# Patient Record
Sex: Male | Born: 1965 | Hispanic: No | Marital: Single | Smoking: Never smoker
Health system: Southern US, Community
[De-identification: ages and names within clinical notes are randomized; demographics above are authoritative.]

## PROBLEM LIST (undated history)

## (undated) HISTORY — PX: OTHER SURGICAL HISTORY: SHX169

---

## 2000-01-28 ENCOUNTER — Encounter: Payer: Self-pay | Admitting: Emergency Medicine

## 2000-01-28 ENCOUNTER — Emergency Department (HOSPITAL_COMMUNITY): Admission: EM | Admit: 2000-01-28 | Discharge: 2000-01-28 | Payer: Self-pay | Admitting: Emergency Medicine

## 2000-09-03 ENCOUNTER — Emergency Department (HOSPITAL_COMMUNITY): Admission: EM | Admit: 2000-09-03 | Discharge: 2000-09-03 | Payer: Self-pay | Admitting: Emergency Medicine

## 2008-03-09 ENCOUNTER — Emergency Department (HOSPITAL_COMMUNITY): Admission: EM | Admit: 2008-03-09 | Discharge: 2008-03-09 | Payer: Self-pay | Admitting: Emergency Medicine

## 2008-04-22 ENCOUNTER — Ambulatory Visit: Payer: Self-pay | Admitting: Internal Medicine

## 2008-04-22 DIAGNOSIS — R519 Headache, unspecified: Secondary | ICD-10-CM | POA: Insufficient documentation

## 2008-04-22 DIAGNOSIS — D509 Iron deficiency anemia, unspecified: Secondary | ICD-10-CM | POA: Insufficient documentation

## 2008-04-22 DIAGNOSIS — R5383 Other fatigue: Secondary | ICD-10-CM

## 2008-04-22 DIAGNOSIS — R5381 Other malaise: Secondary | ICD-10-CM

## 2008-04-22 DIAGNOSIS — F329 Major depressive disorder, single episode, unspecified: Secondary | ICD-10-CM | POA: Insufficient documentation

## 2008-04-22 DIAGNOSIS — R002 Palpitations: Secondary | ICD-10-CM

## 2008-04-22 DIAGNOSIS — D649 Anemia, unspecified: Secondary | ICD-10-CM

## 2008-04-22 DIAGNOSIS — R51 Headache: Secondary | ICD-10-CM

## 2008-04-22 HISTORY — DX: Palpitations: R00.2

## 2008-04-27 ENCOUNTER — Telehealth: Payer: Self-pay | Admitting: Internal Medicine

## 2008-09-13 ENCOUNTER — Emergency Department (HOSPITAL_COMMUNITY): Admission: EM | Admit: 2008-09-13 | Discharge: 2008-09-13 | Payer: Self-pay | Admitting: Emergency Medicine

## 2009-05-13 ENCOUNTER — Emergency Department (HOSPITAL_COMMUNITY): Admission: EM | Admit: 2009-05-13 | Discharge: 2009-05-13 | Payer: Self-pay | Admitting: Emergency Medicine

## 2010-06-13 LAB — CONVERTED CEMR LAB
Ferritin: 168.3 ng/mL (ref 22.0–322.0)
Folate: 10.8 ng/mL
Free T4: 0.8 ng/dL (ref 0.6–1.6)
Iron: 109 ug/dL (ref 42–165)
Saturation Ratios: 46.7 % (ref 20.0–50.0)
Sed Rate: 4 mm/hr (ref 0–16)
TSH: 0.42 microintl units/mL (ref 0.35–5.50)
Transferrin: 166.6 mg/dL — ABNORMAL LOW (ref >=212.0)
Vitamin B-12: 1011 pg/mL — ABNORMAL HIGH (ref 211–911)

## 2010-08-16 LAB — BASIC METABOLIC PANEL
BUN: 9 mg/dL (ref 6–23)
CO2: 31 mEq/L (ref 19–32)
Calcium: 9.6 mg/dL (ref 8.4–10.5)
Chloride: 101 mEq/L (ref 96–112)
Creatinine, Ser: 1.05 mg/dL (ref 0.4–1.5)
GFR calc Af Amer: >60 mL/min (ref >60)
GFR calc non Af Amer: >60 mL/min (ref >60)
Glucose, Bld: 121 mg/dL — ABNORMAL HIGH (ref 70–99)
Potassium: 3.9 mEq/L (ref 3.5–5.1)
Sodium: 137 mEq/L (ref 135–145)

## 2010-08-16 LAB — GLUCOSE, CAPILLARY: Glucose-Capillary: 157 mg/dL — ABNORMAL HIGH (ref 70–99)

## 2010-08-16 LAB — URINALYSIS, ROUTINE W REFLEX MICROSCOPIC
Bilirubin Urine: NEGATIVE
Glucose, UA: NEGATIVE mg/dL
Hgb urine dipstick: NEGATIVE
Ketones, ur: NEGATIVE mg/dL
Nitrite: NEGATIVE
Protein, ur: NEGATIVE mg/dL
Specific Gravity, Urine: 1.022 (ref 1.005–1.030)
Urobilinogen, UA: 0.2 mg/dL (ref 0.0–1.0)
pH: 7.5 (ref 5.0–8.0)

## 2010-08-16 LAB — DIFFERENTIAL
Basophils Relative: 0 % (ref 0–1)
Eosinophils Absolute: 0.1 10*3/uL (ref 0.0–0.7)
Eosinophils Relative: 1 % (ref 0–5)
Lymphocytes Relative: 16 % (ref 12–46)
Monocytes Relative: 6 % (ref 3–12)
Neutrophils Relative %: 77 % (ref 43–77)

## 2010-08-16 LAB — URIC ACID: Uric Acid, Serum: 4 mg/dL (ref 4.0–7.8)

## 2010-08-16 LAB — OSMOLALITY: Osmolality: 289 mOsm/kg (ref 275–300)

## 2010-08-16 LAB — CBC
Hemoglobin: 13.8 g/dL (ref 13.0–17.0)
RBC: 6.35 MIL/uL — ABNORMAL HIGH (ref 4.22–5.81)
WBC: 6.6 10*3/uL (ref 4.0–10.5)

## 2010-08-24 LAB — DIFFERENTIAL
Basophils Relative: 0 % (ref 0–1)
Eosinophils Relative: 0 % (ref 0–5)
Lymphs Abs: 1.4 10*3/uL (ref 0.7–4.0)
Monocytes Absolute: 0.9 10*3/uL (ref 0.1–1.0)

## 2010-08-24 LAB — COMPREHENSIVE METABOLIC PANEL
ALT: 26 U/L (ref 0–53)
AST: 20 U/L (ref 0–37)
CO2: 29 mEq/L (ref 19–32)
Calcium: 9.6 mg/dL (ref 8.4–10.5)
GFR calc Af Amer: >60 mL/min (ref >60)
GFR calc non Af Amer: >60 mL/min (ref >60)
Sodium: 139 mEq/L (ref 135–145)
Total Protein: 7.4 g/dL (ref 6.0–8.3)

## 2010-08-24 LAB — CBC
MCHC: 32 g/dL (ref 30.0–36.0)
RBC: 6.48 MIL/uL — ABNORMAL HIGH (ref 4.22–5.81)
WBC: 12.4 10*3/uL — ABNORMAL HIGH (ref 4.0–10.5)

## 2011-02-15 LAB — COMPREHENSIVE METABOLIC PANEL
ALT: 26
CO2: 28
Calcium: 9.3
Creatinine, Ser: 1.02
GFR calc non Af Amer: >60
Glucose, Bld: 99

## 2011-02-15 LAB — URINALYSIS, ROUTINE W REFLEX MICROSCOPIC
Bilirubin Urine: NEGATIVE
Glucose, UA: NEGATIVE
Hgb urine dipstick: NEGATIVE
Specific Gravity, Urine: 1.02
pH: 7

## 2011-02-15 LAB — DIFFERENTIAL
Basophils Absolute: 0.1
Eosinophils Absolute: 0.1
Lymphocytes Relative: 16
Monocytes Absolute: 0.4
Neutro Abs: 5.5
Neutrophils Relative %: 75

## 2011-02-15 LAB — CBC
Hemoglobin: 12.7 — ABNORMAL LOW
MCHC: 32.1
MCV: 66.5 — ABNORMAL LOW
RBC: 5.93 — ABNORMAL HIGH

## 2021-09-26 DIAGNOSIS — M272 Inflammatory conditions of jaws: Secondary | ICD-10-CM | POA: Diagnosis present

## 2021-09-26 DIAGNOSIS — F4310 Post-traumatic stress disorder, unspecified: Secondary | ICD-10-CM | POA: Diagnosis present

## 2021-09-26 DIAGNOSIS — D509 Iron deficiency anemia, unspecified: Secondary | ICD-10-CM | POA: Diagnosis present

## 2021-09-26 DIAGNOSIS — A419 Sepsis, unspecified organism: Principal | ICD-10-CM | POA: Diagnosis present

## 2021-09-26 DIAGNOSIS — M8448XA Pathological fracture, other site, initial encounter for fracture: Secondary | ICD-10-CM | POA: Diagnosis present

## 2021-09-26 DIAGNOSIS — K122 Cellulitis and abscess of mouth: Secondary | ICD-10-CM | POA: Diagnosis present

## 2021-09-27 ENCOUNTER — Encounter (HOSPITAL_COMMUNITY)
Admission: EM | Disposition: A | Discharge status: Home Health | Payer: Self-pay | Source: Home / Self Care | Attending: Internal Medicine

## 2021-09-27 ENCOUNTER — Other Ambulatory Visit: Payer: Self-pay

## 2021-09-27 ENCOUNTER — Inpatient Hospital Stay (HOSPITAL_COMMUNITY): Payer: Self-pay | Admitting: Certified Registered Nurse Anesthetist

## 2021-09-27 ENCOUNTER — Emergency Department (HOSPITAL_COMMUNITY): Payer: Self-pay

## 2021-09-27 ENCOUNTER — Inpatient Hospital Stay: Admit: 2021-09-27 | Payer: Self-pay | Admitting: Oral Surgery

## 2021-09-27 ENCOUNTER — Encounter (HOSPITAL_COMMUNITY): Payer: Self-pay | Admitting: Emergency Medicine

## 2021-09-27 ENCOUNTER — Inpatient Hospital Stay (HOSPITAL_COMMUNITY)
Admission: EM | Admit: 2021-09-27 | Discharge: 2021-10-01 | DRG: 854 | Disposition: A | Discharge status: Home Health | Payer: Self-pay | Attending: Internal Medicine | Admitting: Internal Medicine

## 2021-09-27 DIAGNOSIS — D509 Iron deficiency anemia, unspecified: Secondary | ICD-10-CM

## 2021-09-27 DIAGNOSIS — R6510 Systemic inflammatory response syndrome (SIRS) of non-infectious origin without acute organ dysfunction: Secondary | ICD-10-CM

## 2021-09-27 DIAGNOSIS — M272 Inflammatory conditions of jaws: Secondary | ICD-10-CM

## 2021-09-27 DIAGNOSIS — K047 Periapical abscess without sinus: Secondary | ICD-10-CM

## 2021-09-27 DIAGNOSIS — M869 Osteomyelitis, unspecified: Principal | ICD-10-CM

## 2021-09-27 DIAGNOSIS — D649 Anemia, unspecified: Secondary | ICD-10-CM

## 2021-09-27 DIAGNOSIS — M8448XA Pathological fracture, other site, initial encounter for fracture: Secondary | ICD-10-CM | POA: Diagnosis present

## 2021-09-27 DIAGNOSIS — Z720 Tobacco use: Secondary | ICD-10-CM

## 2021-09-27 HISTORY — DX: Systemic inflammatory response syndrome (sirs) of non-infectious origin without acute organ dysfunction: R65.10

## 2021-09-27 HISTORY — PX: TOOTH EXTRACTION: SHX859

## 2021-09-27 LAB — CBC
HCT: 36.1 % — ABNORMAL LOW (ref 39.0–52.0)
Hemoglobin: 11.6 g/dL — ABNORMAL LOW (ref 13.0–17.0)
MCH: 20.8 pg — ABNORMAL LOW (ref 26.0–34.0)
MCHC: 32.1 g/dL (ref 30.0–36.0)
MCV: 64.7 fL — ABNORMAL LOW (ref 80.0–100.0)
Platelets: UNDETERMINED 10*3/uL (ref 150–400)
RBC: 5.58 MIL/uL (ref 4.22–5.81)
RDW: 16.6 % — ABNORMAL HIGH (ref 11.5–15.5)
WBC: 15.3 10*3/uL — ABNORMAL HIGH (ref 4.0–10.5)
nRBC: 0 % (ref 0.0–0.2)

## 2021-09-27 LAB — BASIC METABOLIC PANEL
Anion gap: 6 (ref 5–15)
BUN: 12 mg/dL (ref 6–20)
CO2: 27 mmol/L (ref 22–32)
Calcium: 9 mg/dL (ref 8.9–10.3)
Chloride: 106 mmol/L (ref 98–111)
Creatinine, Ser: 0.79 mg/dL (ref 0.61–1.24)
GFR, Estimated: >60 mL/min (ref >60)
Glucose, Bld: 112 mg/dL — ABNORMAL HIGH (ref 70–99)
Potassium: 3.9 mmol/L (ref 3.5–5.1)
Sodium: 139 mmol/L (ref 135–145)

## 2021-09-27 LAB — IRON AND TIBC
Iron: 18 ug/dL — ABNORMAL LOW (ref 45–182)
Saturation Ratios: 9 % — ABNORMAL LOW (ref 17.9–39.5)
TIBC: 197 ug/dL — ABNORMAL LOW (ref 250–450)
UIBC: 179 ug/dL

## 2021-09-27 LAB — LACTIC ACID, PLASMA
Lactic Acid, Venous: 0.8 mmol/L (ref 0.5–1.9)
Lactic Acid, Venous: 0.7 mmol/L (ref 0.5–1.9)

## 2021-09-27 LAB — SURGICAL PCR SCREEN
MRSA, PCR: NEGATIVE
Staphylococcus aureus: NEGATIVE

## 2021-09-27 LAB — HIV ANTIBODY (ROUTINE TESTING W REFLEX): HIV Screen 4th Generation wRfx: NONREACTIVE

## 2021-09-27 SURGERY — DENTAL RESTORATION/EXTRACTIONS
Anesthesia: General | Laterality: Left

## 2021-09-27 MED ORDER — LIDOCAINE-EPINEPHRINE 1 %-1:100000 IJ SOLN
INTRAMUSCULAR | Status: DC | PRN
  Administered 2021-09-27: 7.5 mL

## 2021-09-27 MED ORDER — ROCURONIUM BROMIDE 10 MG/ML (PF) SYRINGE
PREFILLED_SYRINGE | INTRAVENOUS | Status: DC | PRN
  Administered 2021-09-27: 40 mg via INTRAVENOUS

## 2021-09-27 MED ORDER — LIDOCAINE 2% (20 MG/ML) 5 ML SYRINGE
INTRAMUSCULAR | Status: DC | PRN
  Administered 2021-09-27: 80 mg via INTRAVENOUS
  Administered 2021-09-27: 20 mg via INTRAVENOUS

## 2021-09-27 MED ORDER — FENTANYL CITRATE (PF) 100 MCG/2ML IJ SOLN
INTRAMUSCULAR | Status: AC
  Filled 2021-09-27: qty 2

## 2021-09-27 MED ORDER — PROPOFOL 10 MG/ML IV BOLUS
INTRAVENOUS | Status: AC
  Filled 2021-09-27: qty 20

## 2021-09-27 MED ORDER — MIDAZOLAM HCL 2 MG/2ML IJ SOLN
INTRAMUSCULAR | Status: AC
  Filled 2021-09-27: qty 2

## 2021-09-27 MED ORDER — SODIUM CHLORIDE 0.9 % IV BOLUS
1000.0000 mL | Freq: Once | INTRAVENOUS | Status: AC
  Administered 2021-09-27: 1000 mL via INTRAVENOUS

## 2021-09-27 MED ORDER — ONDANSETRON HCL 4 MG/2ML IJ SOLN
INTRAMUSCULAR | Status: DC | PRN
  Administered 2021-09-27: 4 mg via INTRAVENOUS

## 2021-09-27 MED ORDER — FENTANYL CITRATE (PF) 100 MCG/2ML IJ SOLN
INTRAMUSCULAR | Status: DC | PRN
  Administered 2021-09-27 (×2): 50 ug via INTRAVENOUS

## 2021-09-27 MED ORDER — LIDOCAINE HCL (PF) 2 % IJ SOLN
INTRAMUSCULAR | Status: AC
  Filled 2021-09-27: qty 5

## 2021-09-27 MED ORDER — LACTATED RINGERS IV SOLN: INTRAVENOUS | Status: AC

## 2021-09-27 MED ORDER — OXYMETAZOLINE HCL 0.05 % NA SOLN
NASAL | Status: AC
  Filled 2021-09-27: qty 30

## 2021-09-27 MED ORDER — MIDAZOLAM HCL 5 MG/5ML IJ SOLN
INTRAMUSCULAR | Status: DC | PRN
  Administered 2021-09-27: 2 mg via INTRAVENOUS

## 2021-09-27 MED ORDER — ONDANSETRON HCL 4 MG/2ML IJ SOLN
INTRAMUSCULAR | Status: AC
  Filled 2021-09-27: qty 2

## 2021-09-27 MED ORDER — SUGAMMADEX SODIUM 200 MG/2ML IV SOLN
INTRAVENOUS | Status: DC | PRN
  Administered 2021-09-27: 200 mg via INTRAVENOUS

## 2021-09-27 MED ORDER — CHLORHEXIDINE GLUCONATE CLOTH 2 % EX PADS: 6.0000 | MEDICATED_PAD | Freq: Once | CUTANEOUS | Status: AC

## 2021-09-27 MED ORDER — AMISULPRIDE (ANTIEMETIC) 5 MG/2ML IV SOLN: 10.0000 mg | Freq: Once | INTRAVENOUS | Status: DC | PRN

## 2021-09-27 MED ORDER — LIDOCAINE-EPINEPHRINE 2 %-1:100000 IJ SOLN
INTRAMUSCULAR | Status: AC
  Filled 2021-09-27: qty 1

## 2021-09-27 MED ORDER — PROPOFOL 10 MG/ML IV BOLUS
INTRAVENOUS | Status: DC | PRN
  Administered 2021-09-27: 150 mg via INTRAVENOUS

## 2021-09-27 MED ORDER — SODIUM CHLORIDE 0.9 % IV SOLN
3.0000 g | Freq: Once | INTRAVENOUS | Status: AC
  Administered 2021-09-27: 3 g via INTRAVENOUS
  Filled 2021-09-27: qty 8

## 2021-09-27 MED ORDER — KETOROLAC TROMETHAMINE 30 MG/ML IJ SOLN
30.0000 mg | Freq: Four times a day (QID) | INTRAMUSCULAR | Status: DC | PRN
  Administered 2021-09-27 – 2021-10-01 (×11): 30 mg via INTRAVENOUS
  Filled 2021-09-27 (×11): qty 1

## 2021-09-27 MED ORDER — 0.9 % SODIUM CHLORIDE (POUR BTL) OPTIME
TOPICAL | Status: DC | PRN
  Administered 2021-09-27: 1000 mL

## 2021-09-27 MED ORDER — SUCCINYLCHOLINE CHLORIDE 200 MG/10ML IV SOSY
PREFILLED_SYRINGE | INTRAVENOUS | Status: DC | PRN
  Administered 2021-09-27: 100 mg via INTRAVENOUS

## 2021-09-27 MED ORDER — DEXAMETHASONE SODIUM PHOSPHATE 10 MG/ML IJ SOLN
INTRAMUSCULAR | Status: AC
  Filled 2021-09-27: qty 1

## 2021-09-27 MED ORDER — KETOROLAC TROMETHAMINE 15 MG/ML IJ SOLN
15.0000 mg | Freq: Once | INTRAMUSCULAR | Status: AC
  Administered 2021-09-27: 15 mg via INTRAVENOUS
  Filled 2021-09-27: qty 1

## 2021-09-27 MED ORDER — SODIUM CHLORIDE 0.9 % IV SOLN
3.0000 g | Freq: Four times a day (QID) | INTRAVENOUS | Status: DC
  Administered 2021-09-27 – 2021-09-30 (×13): 3 g via INTRAVENOUS
  Filled 2021-09-27 (×15): qty 8

## 2021-09-27 MED ORDER — ROCURONIUM BROMIDE 10 MG/ML (PF) SYRINGE
PREFILLED_SYRINGE | INTRAVENOUS | Status: AC
Start: 2021-09-27 — End: ?
  Filled 2021-09-27: qty 10

## 2021-09-27 MED ORDER — CHLORHEXIDINE GLUCONATE CLOTH 2 % EX PADS: 6.0000 | MEDICATED_PAD | Freq: Once | CUTANEOUS | Status: DC

## 2021-09-27 MED ORDER — LIDOCAINE-EPINEPHRINE 2 %-1:100000 IJ SOLN
INTRAMUSCULAR | Status: AC
  Filled 2021-09-27: qty 1.7

## 2021-09-27 MED ORDER — DEXAMETHASONE SODIUM PHOSPHATE 10 MG/ML IJ SOLN
INTRAMUSCULAR | Status: DC | PRN
  Administered 2021-09-27: 10 mg via INTRAVENOUS

## 2021-09-27 MED ORDER — SUCCINYLCHOLINE CHLORIDE 200 MG/10ML IV SOSY
PREFILLED_SYRINGE | INTRAVENOUS | Status: AC
Start: 2021-09-27 — End: ?
  Filled 2021-09-27: qty 10

## 2021-09-27 MED ORDER — DEXMEDETOMIDINE (PRECEDEX) IN NS 20 MCG/5ML (4 MCG/ML) IV SYRINGE
PREFILLED_SYRINGE | INTRAVENOUS | Status: DC | PRN
  Administered 2021-09-27 (×2): 12 ug via INTRAVENOUS
  Administered 2021-09-27: 8 ug via INTRAVENOUS

## 2021-09-27 MED ORDER — ONDANSETRON HCL 4 MG/2ML IJ SOLN: 4.0000 mg | Freq: Four times a day (QID) | INTRAMUSCULAR | Status: DC | PRN

## 2021-09-27 MED ORDER — IOHEXOL 300 MG/ML  SOLN
75.0000 mL | Freq: Once | INTRAMUSCULAR | Status: AC | PRN
  Administered 2021-09-27: 75 mL via INTRAVENOUS

## 2021-09-27 MED ORDER — FENTANYL CITRATE PF 50 MCG/ML IJ SOSY: 25.0000 ug | PREFILLED_SYRINGE | INTRAMUSCULAR | Status: DC | PRN

## 2021-09-27 MED ORDER — ONDANSETRON HCL 4 MG PO TABS: 4.0000 mg | ORAL_TABLET | Freq: Four times a day (QID) | ORAL | Status: DC | PRN

## 2021-09-27 SURGICAL SUPPLY — 27 items
BAG COUNTER SPONGE SURGICOUNT (BAG) IMPLANT
BAG SPNG CNTER NS LX DISP (BAG)
BLADE SURG 15 STRL LF DISP TIS (BLADE) ×2 IMPLANT
BLADE SURG 15 STRL SS (BLADE) ×6
BUR CROSS CUT FISSURE 1.6 (BURR) ×1 IMPLANT
BUR EGG DIAMOND 4.0 (BURR) ×1 IMPLANT
GAUZE 4X4 16PLY ~~LOC~~+RFID DBL (SPONGE) ×4 IMPLANT
GAUZE PACKING 2X5 YD STRL (GAUZE/BANDAGES/DRESSINGS) ×2 IMPLANT
GLOVE BIO SURGEON STRL SZ 6.5 (GLOVE) IMPLANT
GLOVE BIO SURGEON STRL SZ7.5 (GLOVE) ×4 IMPLANT
KIT BASIN OR (CUSTOM PROCEDURE TRAY) ×2 IMPLANT
NDL BLUNT 17GA (NEEDLE) ×1 IMPLANT
NEEDLE BLUNT 17GA (NEEDLE) ×2 IMPLANT
NEEDLE HYPO 22GX1.5 SAFETY (NEEDLE) ×2 IMPLANT
NS IRRIG 1000ML POUR BTL (IV SOLUTION) ×2 IMPLANT
PACK EENT SPLIT (PACKS) ×2 IMPLANT
PENCIL SMOKE EVACUATOR (MISCELLANEOUS) IMPLANT
PROTECTOR NERVE ULNAR (MISCELLANEOUS) ×6 IMPLANT
SLEEVE IRRIGATION ELITE 7 (MISCELLANEOUS) ×1 IMPLANT
SUCTION FRAZIER HANDLE 10FR (MISCELLANEOUS) ×2
SUCTION TUBE FRAZIER 10FR DISP (MISCELLANEOUS) IMPLANT
SUT CHROMIC 3 0 PS 2 (SUTURE) ×4 IMPLANT
SWAB COLLECTION DEVICE MRSA (MISCELLANEOUS) ×1 IMPLANT
SWAB CULTURE ESWAB REG 1ML (MISCELLANEOUS) ×1 IMPLANT
SYR 50ML LL SCALE MARK (SYRINGE) ×2 IMPLANT
SYR CONTROL 10ML LL (SYRINGE) ×1 IMPLANT
WATER STERILE IRR 1000ML POUR (IV SOLUTION) ×2 IMPLANT

## 2021-09-27 NOTE — H&P (Signed)
?History and Physical  ? ? ?Patient: Hunter Hoffman [id number] DOB: 1965-08-03 ?DOA: 09/27/2021 ?DOS: the patient was seen and examined on 09/27/2021 ?PCP: Pcp, No  ?Patient coming from: Home ? ?Chief Complaint:  ?Chief Complaint  ?Patient presents with  ? Facial Swelling  ? ?HPI: Hunter Hoffman is a 56 y.o. male with medical history significant of PTSD. Presenting with left facial swelling. Symptoms started about a month ago. He first noticed that his tooth filling came out and there was a crack in his back tooth. He denies any trauma to the area. It wasn't particularly painful at the time. He tried some salt water gargles and rinsing with H2O2, but they didn't seem to help. He was able to largely ignore the area until it became sharply painful over these last two days. He denies any fever, N/V, headache, visual changes, or drainage. ? ?Review of Systems: As mentioned in the history of present illness. All other systems reviewed and are negative. ? ?PMHx ?PTSD ? ?PSHx ?History reviewed. No pertinent surgical history. ? ?Social History:  Occasional tobacco and EtOH usage. Denies illict Rx usage. ? ?No Known Allergies ? ?FamHx ?History reviewed. No pertinent family history. ? ?Prior to Admission medications   ?Medication Sig Start Date End Date Taking? Authorizing Provider  ?acetaminophen (TYLENOL) 500 MG tablet Take 1,000 mg by mouth every 6 (six) hours as needed for moderate pain.   Yes [provider]  ?ibuprofen (ADVIL) 200 MG tablet Take 200 mg by mouth every 6 (six) hours as needed for moderate pain.   Yes [provider]  ? ? ?Physical Exam: ?Vitals:  ? 09/27/21 0028 09/27/21 0400 09/27/21 0552  ?BP: (!) 143/81 123/75   ?Pulse: 79 70   ?Resp: 18 19   ?Temp: (!) 100.5 ?F (38.1 ?C)    ?TempSrc: Oral    ?SpO2: 97% 98%   ?Weight:   82 kg  ?Height:   5\' 8"  (1.727 m)  ? ?General: 56 y.o. male resting in bed in NAD ?Eyes: PERRL, normal sclera ?ENMT: Nares patent w/o discharge, orophaynx clear,  dentition poor, ears w/o discharge/lesions/ulcers, left facial swelling with mild tenderness to manipulation ?Neck: Supple, trachea midline ?Cardiovascular: RRR, +S1, S2, no m/g/r, equal pulses throughout ?Respiratory: CTABL, no w/r/r, normal WOB ?GI: BS+, NDNT, no masses noted, no organomegaly noted ?MSK: No e/c/c ?Neuro: A&O x 3, no focal deficits ?Psyc: Appropriate interaction and affect, calm/cooperative ? ?Data Reviewed: ? ?Na+ 139 ?CO2  27 ?Glucose  112 ?BUN 12 ?Scr  0.79 ?WBC  15.3 ?Hgb  11.6 ?MCV  64.7 ? ?CT Maxillofacial ?Permeative destruction of the left mandible with extensive swelling and ill-defined necrotic or purulent low-density along the outer surface of the mandible in the buccal and masticator spaces. In the setting of fever and leukocytosis, acute osteomyelitis is favored ?over aggressive malignancy. ? ?Assessment and Plan: ?Acute osteomyelitis of the left jaw ?SIRS ?    - admit to inpt, med-surg ?    - continue unasyn ?    - Oral surgery consulted, appreciate assistance, to OR this evening ?    - NPO for now ?    - fluids ?    - check lactic acid and Bld Cx ?    - consulted ID, appreciate assistance ? ?Microcytic anemia ?    - no evidence of bleed ?    - check iron studies ? ?Tobacco abuse ?    - counsel against further use ? ?Advance Care Planning:   Code Status:  FULL ? ?Consults: Oral Surgery ? ?Family Communication: None at bedside ? ?Severity of Illness: ?The appropriate patient status for this patient is INPATIENT. Inpatient status is judged to be reasonable and necessary in order to provide the required intensity of service to ensure the patient's safety. The patient's presenting symptoms, physical exam findings, and initial radiographic and laboratory data in the context of their chronic comorbidities is felt to place them at high risk for further clinical deterioration. Furthermore, it is not anticipated that the patient will be medically stable for discharge from the hospital within 2  midnights of admission.  ? ?* I certify that at the point of admission it is my clinical judgment that the patient will require inpatient hospital care spanning beyond 2 midnights from the point of admission due to high intensity of service, high risk for further deterioration and high frequency of surveillance required.* ? ?Author: ?Jonnie Finner, DO ?09/27/2021 7:10 AM ? ?For on call review [email or web address].  ?

## 2021-09-27 NOTE — Anesthesia Preprocedure Evaluation (Signed)
Anesthesia Evaluation  ?Patient identified by MRN, date of birth, ID band ?Patient awake ? ? ? ?Reviewed: ?Allergy & Precautions, NPO status , Patient's Chart, lab work & pertinent test results ? ?Airway ?Mallampati: Unable to assess ? ? ?Neck ROM: Full ? ?Mouth opening: Limited Mouth Opening ? Dental ? ?(+) Dental Advisory Given ?  ?Pulmonary ?neg pulmonary ROS,  ?  ?breath sounds clear to auscultation ? ? ? ? ? ? Cardiovascular ?negative cardio ROS ? ? ?Rhythm:Regular Rate:Normal ? ? ?  ?Neuro/Psych ?negative neurological ROS ?   ? GI/Hepatic ?negative GI ROS, Neg liver ROS,   ?Endo/Other  ?negative endocrine ROS ? Renal/GU ?negative Renal ROS  ? ?  ?Musculoskeletal ? ? Abdominal ?  ?Peds ? Hematology ? ?(+) Blood dyscrasia, anemia ,   ?Anesthesia Other Findings ? ? Reproductive/Obstetrics ? ?  ? ? ? ? ? ? ? ? ? ? ? ? ? ?  ?  ? ? ? ? ? ? ? ? ?Anesthesia Physical ?Anesthesia Plan ? ?ASA: 3 ? ?Anesthesia Plan: General  ? ?Post-op Pain Management: Ofirmev IV (intra-op)* and Toradol IV (intra-op)*  ? ?Induction: Intravenous ? ?PONV Risk Score and Plan: 2 and Dexamethasone, Ondansetron and Treatment may vary due to age or medical condition ? ?Airway Management Planned: Oral ETT and Video Laryngoscope Planned ? ?Additional Equipment: None ? ?Intra-op Plan:  ? ?Post-operative Plan: Extubation in OR and Possible Post-op intubation/ventilation ? ?Informed Consent: I have reviewed the patients History and Physical, chart, labs and discussed the procedure including the risks, benefits and alternatives for the proposed anesthesia with the patient or authorized representative who has indicated his/her understanding and acceptance.  ? ? ? ?Dental advisory given ? ?Plan Discussed with: CRNA ? ?Anesthesia Plan Comments:   ? ? ? ? ? ? ?Anesthesia Quick Evaluation ? ?

## 2021-09-27 NOTE — TOC Initial Note (Signed)
Transition of Care (TOC) - Initial/Assessment Note  ? ?Patient Details  ?Name: Hunter Hoffman ?MRN: [id number] ?Date of Birth: [birth year] ? ?Transition of Care (TOC) CM/SW Contact:    ?Ewing Schlein, LCSW ?Phone Number: ?09/27/2021, 10:37 AM ? ?Clinical Narrative: Per chart review, patient does not have insurance or a PCP. CSW notified by RN that patient does not want assistance with a PCP referral. ? ?Expected Discharge Plan: Home/Self Care ?Barriers to Discharge: Inadequate or no insurance ? ?Patient Goals and CMS Choice ?Choice offered to / list presented to : NA ? ?Expected Discharge Plan and Services ?Expected Discharge Plan: Home/Self Care ?Post Acute Care Choice: NA ?Living arrangements for the past 2 months: Single Family Home            ?DME Arranged: N/A ?DME Agency: NA ? ?Prior Living Arrangements/Services ?Living arrangements for the past 2 months: Single Family Home ?Patient language and need for interpreter reviewed:: Yes ?Need for Family Participation in Patient Care: No (Comment) ?Care giver support system in place?: Yes (comment) ?Criminal Activity/Legal Involvement Pertinent to Current Situation/Hospitalization: No - Comment as needed ? ?Activities of Daily Living ?Home Assistive Devices/Equipment: None ?ADL Screening (condition at time of admission) ?Patient's cognitive ability adequate to safely complete daily activities?: Yes ?Is the patient deaf or have difficulty hearing?: No ?Does the patient have difficulty seeing, even when wearing glasses/contacts?: No ?Does the patient have difficulty concentrating, remembering, or making decisions?: No ?Patient able to express need for assistance with ADLs?: Yes ?Does the patient have difficulty dressing or bathing?: No ?Independently performs ADLs?: Yes (appropriate for developmental age) ?Does the patient have difficulty walking or climbing stairs?: No ?Weakness of Legs: None ?Weakness of Arms/Hands: None ? 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 Diagnosis Date Noted  ? Acute osteomyelitis of jaw 09/27/2021  ? SIRS (systemic inflammatory response syndrome) (HCC) 09/27/2021  ? Tobacco abuse 09/27/2021  ? Microcytic anemia 04/22/2008  ? DEPRESSION 04/22/2008  ? FATIGUE, CHRONIC 04/22/2008  ? HEADACHE 04/22/2008  ? PALPITATIONS 04/22/2008  ? ?PCP:  Pcp, No ?Pharmacy:   ?[id number] [place], [place] - [address] AT [facility] [address] ?[address] ?[place] [address] ?Phone: [phone] Fax: [phone] ? ?Readmission Risk Interventions ?   ? View : No data to display.  ?  ?  ?  ? ?

## 2021-09-27 NOTE — Anesthesia Procedure Notes (Signed)
Procedure Name: Intubation ?Date/Time: 09/27/2021 4:59 PM ?Performed by: Ezekiel Ina, CRNA ?Pre-anesthesia Checklist: Patient identified, Emergency Drugs available, Suction available and Patient being monitored ?Patient Re-evaluated:Patient Re-evaluated prior to induction ?Oxygen Delivery Method: Circle system utilized ?Preoxygenation: Pre-oxygenation with 100% oxygen ?Induction Type: IV induction ?Ventilation: Mask ventilation without difficulty ?Laryngoscope Size: Glidescope and 4 ?Grade View: Grade II ?Tube type: Reinforced ?Tube size: 7.5 mm ?Number of attempts: 1 ?Airway Equipment and Method: Rigid stylet and Video-laryngoscopy ?Placement Confirmation: ETT inserted through vocal cords under direct vision, positive ETCO2 and breath sounds checked- equal and bilateral ?Secured at: 23 cm ?Tube secured with: Tape ?Dental Injury: Teeth and Oropharynx as per pre-operative assessment  ?Difficulty Due To: Difficulty was anticipated and Difficult Airway- due to limited oral opening ? ? ? ? ?
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
## 2021-09-27 NOTE — Procedures (Incomplete)
?  PRE-OPERATIVE DIAGNOSIS:  Odontogenic infection ?POST-OPERATIVE DIAGNOSIS:  Same  ?  ?SURGEON:  Surgeon(s) and Role: ?   Ross Marcus, Lavell Anchors, DMD - Primary ?  ?Procedure: ?Surgical removal #1-3, 14, 16, 17-21, 31, 32 ([id number] x 12) ?I&D of masticator space  ?I&D of buccal space ?Debridement of the mandible ?  ?Description of Operation/Procedure: ?  ?The patient was encountered in [facility] OR Room ***.  General anesthesia was induced and an oral ETT was secured in the standard fashion.  The table was moved slightly away from anesthesia and the patient was properly padded, relieving all pressure points.  A formal time-out was executed.  2% lidocaine with 1:100,000 epinephrine was infiltrated into the proposed surgical sites.  The patient was prepped and draped in the standard sterile fashion and a throat pack was placed.              ?  ? ?Attention was directed to #1.  A FTMPF was reflected as necessary, bone was removed around the tooth which was removed surgically without complication.  Bone was smoothed as necessary, and the site was curetted and irrigated.  The site was closed. An identical procedure was completed for #2-3, 14, 16, 31 and 32. ?  ?Attention was then directed intraorally to the lower left.  A full thickness mucoperiosteal flap was elevated on both the buccal and lingual.  Blunt dissection was carried to the buccal and masticator spaces yielding purulence.. This was cultured sent for aerobic and anaerobic cultures and a stat Gram stain.  Teeth #17-21 were removed with standard elevator and forceps technique without complication. The extraction sites were thoroughly curetted, bone was smoothed, and the sites thoroughly irrigated. The mandible was found to have extensive amount of loose bony sequestra and granulation tissue. Samples of this hard/soft tissue was submitted for both micro and pathological review. *** ? ?Areas were thoroughly irrigated.  The full thickness mucoperiosteal flap was  reapproximated with 3-0 chromic gut suture.  The oral cavity was suctioned free of all debris and secretions.  The throat pack was removed and an orogastric tube was passed to evacuate the stomach contents.  Sponge and needle counts were correct x 2.  Care of the patient was turned over to the Anesthesia team for uneventful extubation and delivery of the patient to the PACU in stable condition. ?  ?ANESTHESIA:   general ?EBL:  50 mL  ?DRAINS: None ?LOCAL MEDICATIONS USED:  LIDOCAINE 2% w/ 1:100000 epi ?SPECIMEN:  Swab, bone/soft tissue - cultures for anaerobes, aerobes, and fungal as well as final path ?PLAN OF CARE: Return to floor ?PATIENT DISPOSITION:  PACU - hemodynamically stable. ?Delay start of Pharmacological VTE agent (>24hrs) due to surgical blood loss or risk of bleeding: no ?  ?OMFS Recommendations ?-Liquid diet today, advance to soft mechanical diet tomorrow ?-Peridex (chlorhexidine) mouthrnise QID ?-Continue Unasyn q6hr ?-Recommend Infectious disease consult for inpatient and outpatient antibiotic regimen recommendations ?-Discussed with patient we will address his acute exacerbation during this hospital admission however due to the severity of his bony destruction of CT there is a good change he will require additional surgery and possibly may need mandibular resection/reconstruction; this will require a higher level of care and necessity will be determined with follow-up ?

## 2021-09-27 NOTE — H&P (Signed)
H&P Infection ? ?Exam Date: 09/27/21 ? ?ID: The patient is a 16 yoM who presented with pain and swelling of the left face. ? ?History of Present Illness:  The patient reports having pain and swelling of the left jaw that began over a month ago but got acutely worse over the weekend.  The patient was referred by ED for evaluation; CT maxface concerning for osteomyelitis of the left mandible. Denies history of cancer, H&N radiation or bisphosphonate treatment.  The patient reports trismus and pain.  Denies dyspnea or dysphagia.  Patient is NPO and on Unasyn. ?  ?Clinical Exam: ?Extraoral Exam:  ?            Patient is alert, orientated and in mild distress ?            CN II-XII grossly intact with left V3 parethesia ?            There is appreciable facial swelling on the left side of the face.  ?            The swelling does not extend into the neck/periorbital region  ?  ?Intraoral Exam:  ?            The patient does have trismus with maximum incisal opening of 20 mm. ?            The buccal vestibule is raised. ?            The floor of mouth is soft and non-elevated ?            The tongue is not elevated. ?            There is not lateral pharyngeal swelling or palatal draping ?            Oral airway is patent  ?            Dental evaluation is limited due to trismus ?  ?            Cardiovascular:  Regular rate and rhythm without any appreciable murmurs, gallops or rubs.   ?            Respiratory: Lungs were clear to auscultation bilaterally without any wheezing or rhonchi. ?            Abdomen: Non-distended  ?  ?Radiographic Exam:   ?            Panorex imaging not available. ?             ?            A CT of the larynx with contrast was obtained showing fluid collections/cellulitis associated with the left buccal/masticator spaces. There is no airway deviation. There is a moth-eaten, radiolucencies of the left mandible with cortical disruption extending from tooth #21 posteriorly to the sigmoid notch. The  osteolytic changes extend to the inferior border of the mandible as well as the posterior border of the ramus; no clear evidence of pathologic fracture at this time however cannot rule that out. ? ?CT shows generalized poor dentition/caries with periapical radiolucencies associated with teeth #1-3, 14, 16, 31, and 32 as well as above-mentioned osteomyelitis involving teeth #17-21. ?  ?Assessment: 62 yoM patient with presumed osteomyelitis of the left mandible and acute exacerbation resulting in left buccal/masticator space infection.  ?  ?Plan:  The patient will require incision and drainage of the buccal and masticator space infection with extraction of teeth as  necessary, including #1-3, 14, 16, 17-21, 31, 32 as well as exploration/conservative debridement of the left mandible in the OR.  Consent will be obtained and will be scanned into EPIC. ? ?-Recommend Infectious disease consult for outpatient antibiotic regimen; will obtain path specimen and cultures intra-op ?-Discussed with patient we will address his acute exacerbation during this hospital admission however due to the severity of his bony destruction of CT there is a good change he will require additional surgery and possibly may need mandibular resection/reconstruction; this will require a higher level of care and necessity will be determined with follow-up ?  ?Risks, complications and alternatives of tooth extraction and incision and drainage procedures were discussed and questions were answered.  Among all potential risks and complications, I emphasized the potential for pain, bleeding, swelling, infection, localized alveolar osteitis (dry socket), temporary and permanent lingual and inferior alveolar nerve injury, oroantral (sinus) communication, oronasal communication, jaw fracture, damage to adjacent teeth and tissue, joint discomfort, bone/tooth fragments, recurrence of infection, need for additional procedures, facial nerve injury, scarring,  limited mouth opening, drain placement, aspiration and anesthetic mishap. ?  ?Terese Door, DDS ?Oral and Maxillofacial Surgeon ?[facility] ([place] ?Office # [phone] ?Cell # [phone] ? ?

## 2021-09-27 NOTE — ED Provider Notes (Signed)
?[facility]-EMERGENCY DEPT ?[facility] Emergency Department ?Provider Note ?MRN:  [id number]  ?Arrival date & time: 09/27/21    ? ?Chief Complaint   ?Facial Swelling ?  ?History of Present Illness   ?Hunter Hoffman is a 56 y.o. year-old male with no pertinent past medical history presenting to the ED with chief complaint of facial swelling. ? ?Some dental pain and left-sided facial swelling for the past week or so.  Thought it would go away but it has not.  Swelling has worsened, trouble opening the mouth. ? ?Review of Systems  ?A thorough review of systems was obtained and all systems are negative except as noted in the HPI and PMH.  ? ?Patient's Health History   ?History reviewed. No pertinent past medical history.  ?History reviewed. No pertinent surgical history.  ?History reviewed. No pertinent family history.  ?Social History  ? ?Socioeconomic History  ? Marital status: Single  ?  Spouse name: Not on file  ? Number of children: Not on file  ? Years of education: Not on file  ? Highest education level: Not on file  ?Occupational History  ? Not on file  ?Tobacco Use  ? Smoking status: Not on file  ? Smokeless tobacco: Not on file  ?Substance and Sexual Activity  ? Alcohol use: Not on file  ? Drug use: Not on file  ? Sexual activity: Not on file  ?Other Topics Concern  ? Not on file  ?Social History Narrative  ? Not on file  ? ?Social Determinants of Health  ? ?Financial Resource Strain: Not on file  ?Food Insecurity: Not on file  ?Transportation Needs: Not on file  ?Physical Activity: Not on file  ?Stress: Not on file  ?Social Connections: Not on file  ?Intimate Partner Violence: Not on file  ?  ? ?Physical Exam  ? ?Vitals:  ? 09/27/21 0028 09/27/21 0400  ?BP: (!) 143/81 123/75  ?Pulse: 79 70  ?Resp: 18 19  ?Temp: (!) 100.5 ?F (38.1 ?C)   ?SpO2: 97% 98%  ?  ?CONSTITUTIONAL: Well-appearing, NAD ?NEURO/PSYCH:  Alert and oriented x 3, no focal deficits ?EYES:  eyes equal and reactive ?ENT/NECK:  no LAD, no  JVD ?CARDIO: Regular rate, well-perfused, normal S1 and S2 ?PULM:  CTAB no wheezing or rhonchi ?GI/GU:  non-distended, non-tender ?MSK/SPINE:  No gross deformities, no edema ?SKIN: Prominent swelling to the left side of the face largely involving the buccal mucosa ? ? ?*Additional and/or pertinent findings included in MDM below ? ?Diagnostic and Interventional Summary  ? ? EKG Interpretation ? ?Date/Time:    ?Ventricular Rate:    ?PR Interval:    ?QRS Duration:   ?QT Interval:    ?QTC Calculation:   ?R Axis:     ?Text Interpretation:   ?  ? ?  ? ?Labs Reviewed  ?CBC - Abnormal; Notable for the following components:  ?    Result Value  ? WBC 15.3 (*)   ? Hemoglobin 11.6 (*)   ? HCT 36.1 (*)   ? MCV 64.7 (*)   ? MCH 20.8 (*)   ? RDW 16.6 (*)   ? All other components within normal limits  ?BASIC METABOLIC PANEL - Abnormal; Notable for the following components:  ? Glucose, Bld 112 (*)   ? All other components within normal limits  ?  ?CT Maxillofacial W Contrast  ?Final Result  ?  ?  ?Medications  ?lactated ringers infusion ( Intravenous New Bag/Given 09/27/21 0631)  ?Ampicillin-Sulbactam (UNASYN) 3 g  in sodium chloride 0.9 % 100 mL IVPB (has no administration in time range)  ?ketorolac (TORADOL) 15 MG/ML injection 15 mg (15 mg Intravenous Given 09/27/21 0323)  ?Ampicillin-Sulbactam (UNASYN) 3 g in sodium chloride 0.9 % 100 mL IVPB (0 g Intravenous Stopped 09/27/21 0631)  ?sodium chloride 0.9 % bolus 1,000 mL (0 mLs Intravenous Stopped 09/27/21 0631)  ?iohexol (OMNIPAQUE) 300 MG/ML solution 75 mL (75 mLs Intravenous Contrast Given 09/27/21 0411)  ?  ? ?Procedures  /  Critical Care ?.Critical Care ?Performed by: Sabas Sous, MD ?Authorized by: Sabas Sous, MD  ? ?Critical care provider statement:  ?  Critical care time (minutes):  35 ?  Critical care was necessary to treat or prevent imminent or life-threatening deterioration of the following conditions: Osteomyelitis. ?  Critical care was time spent personally by me  on the following activities:  Development of treatment plan with patient or surrogate, discussions with consultants, evaluation of patient's response to treatment, examination of patient, ordering and review of laboratory studies, ordering and review of radiographic studies, ordering and performing treatments and interventions, pulse oximetry, re-evaluation of patient's condition and review of old charts ? ?ED Course and Medical Decision Making  ?Initial Impression and Ddx ?Concern for facial cellulitis versus odontogenic abscess.  Febrile on arrival but well-appearing, otherwise normal vital signs.  Awaiting CT imaging. ? ?Past medical/surgical history that increases complexity of ED encounter: None ? ?Interpretation of Diagnostics ?I personally reviewed the laboratory assess and my interpretation is as follows: Leukocytosis, no significant electrolyte disturbance ?   ?CT imaging revealing destructive osteomyelitis of the mandible ? ?Patient Reassessment and Ultimate Disposition/Management ?Case discussed with oral surgery who will follow along, admitted to hospital service for further care. ? ?Patient management required discussion with the following services or consulting groups:  Hospitalist Service and ENT/Plastic Surgery ? ?Complexity of Problems Addressed ?Acute illness or injury that poses threat of life of bodily function ? ?Additional Data Reviewed and Analyzed ?Further history obtained from: ?None ? ?Additional Factors Impacting ED Encounter Risk ?Consideration of hospitalization ? ?Elmer Sow. Pilar Plate, MD ?[facility] ?[facility] [email or web address] ? ?Final Clinical Impressions(s) / ED Diagnoses  ? ?  ICD-10-CM   ?1. Osteomyelitis of other site, unspecified type (HCC)  M86.9   ?  ?  ?ED Discharge Orders   ? ? None  ? ?  ?  ? ?Discharge Instructions Discussed with and Provided to Patient:  ? ?Discharge Instructions   ?None ?  ? ?  ?Sabas Sous, MD ?09/27/21 [phone] ? ?

## 2021-09-27 NOTE — Consult Note (Signed)
?  [facility]  ? ? ?Date of Admission:  09/27/2021    ? ?Reason for Consult: Osteomyelitis ?    ?Referring Physician: Dr. Marylyn Ishihara ? ?Current antibiotics: ?Unasyn 5/15--present ? ? ? ?ASSESSMENT:   ? ?56 y.o. male admitted with: ? ?Osteomyelitis of the left jaw: Presenting with approximately 1 month of symptoms with acute worsening over the last 2 to 3 days prior to admission where he was noted to be febrile with leukocytosis.  CT shows permeative destruction of the left mandible with extensive swelling and ill-defined necrotic or purulent low density along the outer surface of the mandible in the buccal and masticator spaces concerning for acute osteomyelitis. ?Sepsis: Secondary to #1. ? ?RECOMMENDATIONS:   ? ?Continue Unasyn ?OR today with oral surgery where cultures and path will be obtained ?Follow blood cultures ?Lab monitoring ?Will follow ? ? ?Principal Problem: ?  Acute osteomyelitis of jaw ?Active Problems: ?  Microcytic anemia ?  SIRS (systemic inflammatory response syndrome) (HCC) ?  Tobacco abuse ? ? ?MEDICATIONS:   ? ?Scheduled Meds: ?Continuous Infusions: ? ampicillin-sulbactam (UNASYN) IV 3 g (09/27/21 1055)  ? lactated ringers 125 mL/hr at 09/27/21 0631  ? ?PRN Meds:.ketorolac, ondansetron **OR** ondansetron (ZOFRAN) IV ? ?HPI:   ? ?Hunter Hoffman is a 56 y.o. male with no significant past medical history presented to the emergency department with a chief complaint of left-sided facial swelling and dental pain.  He reports that his symptoms a started approximately 1 month ago.  He noticed that he had a prior feeling that had fallen out as well as a crack in his back tooth.  Denied any particular trauma to the area.  This area was not particularly painful and he tried to manage with salt water gargles.  However this area became acutely swollen and painful over the last 2 days prompting him to come to the ER.  Upon presentation, he was noted to be febrile at 100.5.  Imaging was  obtained which showed permeative destruction of the left mandible with extensive swelling and ill-defined necrotic or purulent low-density along the outer surface of the mandible in the buccal and masticator spaces.  This was felt to be suggestive of acute osteomyelitis given his fevers and leukocytosis.  He was started on Unasyn.  He will be going to the OR later this afternoon with oral surgery. ? ? ?History reviewed. No pertinent past medical history. ? ?Social History  ? ?Tobacco Use  ? Smoking status: Never  ? Smokeless tobacco: Never  ? ? ?History reviewed. No pertinent family history. ? ?No Known Allergies ? ?Review of Systems  ?All other systems reviewed and are negative.  Except as noted above in the HPI. ? ?OBJECTIVE:  ? ?Blood pressure 131/80, pulse 68, temperature 99.1 ?F (37.3 ?C), temperature source Oral, resp. rate 15, height 5' 8"  (1.727 m), weight 82 kg, SpO2 100 %. ?Body mass index is 27.49 kg/m?. ? ?Physical Exam ?Constitutional:   ?   General: He is not in acute distress. ?   Appearance: Normal appearance.  ?HENT:  ?   Head:  ?   Comments: Left-sided facial swelling ?Eyes:  ?   Extraocular Movements: Extraocular movements intact.  ?   Conjunctiva/sclera: Conjunctivae normal.  ?Cardiovascular:  ?   Rate and Rhythm: Normal rate and regular rhythm.  ?Pulmonary:  ?   Effort: Pulmonary effort is normal. No respiratory distress.  ?   Breath sounds: Normal breath sounds.  ?Abdominal:  ?  General: There is no distension.  ?   Palpations: Abdomen is soft.  ?Musculoskeletal:     ?   General: Normal range of motion.  ?   Cervical back: Normal range of motion and neck supple.  ?Skin: ?   General: Skin is warm and dry.  ?Neurological:  ?   General: No focal deficit present.  ?   Mental Status: He is alert and oriented to person, place, and time.  ?Psychiatric:     ?   Mood and Affect: Mood normal.     ?   Behavior: Behavior normal.  ? ? ? ?Lab Results: ?Lab Results  ?Component Value Date  ? WBC 15.3 (H)  09/27/2021  ? HGB 11.6 (L) 09/27/2021  ? HCT 36.1 (L) 09/27/2021  ? MCV 64.7 (L) 09/27/2021  ? PLT PLATELET CLUMPS NOTED ON SMEAR, UNABLE TO ESTIMATE 09/27/2021  ?  ?Lab Results  ?Component Value Date  ? NA 139 09/27/2021  ? K 3.9 09/27/2021  ? CO2 27 09/27/2021  ? GLUCOSE 112 (H) 09/27/2021  ? BUN 12 09/27/2021  ? CREATININE 0.79 09/27/2021  ? CALCIUM 9.0 09/27/2021  ? GFRNONAA >60 09/27/2021  ? GFRAA  05/13/2009  ?  >60        ?The eGFR has been calculated ?using the MDRD equation. ?This calculation has not been ?validated in all clinical ?situations. ?eGFR's persistently ?<60 mL/min signify ?possible Chronic Kidney Disease.  ?  ?Lab Results  ?Component Value Date  ? ALT 26 09/13/2008  ? AST 20 09/13/2008  ? ALKPHOS 61 09/13/2008  ? BILITOT 1.0 09/13/2008  ? ? ?No results found for: CRP ? ?   ?Component Value Date/Time  ? ESRSEDRATE 4 04/22/2008 0000  ? ? ?I have reviewed the micro and lab results in Epic. ? ?Imaging: ?CT Maxillofacial W Contrast ? ?Result Date: 09/27/2021 ?CLINICAL DATA:  Maxillary/facial abscess EXAM: CT MAXILLOFACIAL WITH CONTRAST TECHNIQUE: Multidetector CT imaging of the maxillofacial structures was performed with intravenous contrast. Multiplanar CT image reconstructions were also generated. RADIATION DOSE REDUCTION: This exam was performed according to the departmental dose-optimization program which includes automated exposure control, adjustment of the mA and/or kV according to patient size and/or use of iterative reconstruction technique. CONTRAST:  59m OMNIPAQUE IOHEXOL 300 MG/ML  SOLN COMPARISON:  None Available. FINDINGS: Osseous: Permeative destruction in the left mandible extending from the level of the canine to the coronoid process and mandibular neck. The regional soft tissues are swollen at the level of the masticator space and cheek with ill-defined low-density and peripheral enhancement along the superficial aspect of the diseased bone. Diffuse dental caries with periapical  erosion without single offending tooth seen in the left mandible. Acute osteomyelitis or aggressive malignancy are both considered with this radiographic appearance. Case discussed with Dr. BSedonia Small somewhat protracted symptoms but there is also fever and white count. Inflammatory markers, blood cultures, or biopsy may clarify. 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 There is C4-5 non segmentation with subjacent predominant disc collapse, spurring, and endplate sclerosis. Asymmetric upper right facet spurring encroaching on the foramina. Orbits: No evidence of inflammation or mass. Sinuses: Negative Soft tissues: Negative Limited intracranial: Negative IMPRESSION: Permeative destruction of the left mandible with extensive swelling and ill-defined necrotic or purulent low-density along the outer surface of the mandible in the buccal and masticator spaces. In the setting of fever and leukocytosis, acute osteomyelitis is favored over aggressive malignancy. Electronically Signed   By: JJorje GuildM.D.   On: 09/27/2021 04:36    ? ?  Imaging independently reviewed in Epic. ? ?Mignon Pine ?[facility] ?[place] ?[phone] pager ?09/27/2021, 11:55 AM ? ? ?

## 2021-09-27 NOTE — Progress Notes (Signed)
Pharmacy Antibiotic Note ? ?Hunter Hoffman is a 56 y.o. male admitted on 09/27/2021 with mandible abscess.  Pharmacy has been consulted for Unasyn dosing. ? ?Plan: ?Unasyn 3gm IV q6h ?Need for further dosage adjustment appears unlikely at present.   ? ?Will sign off at this time.  Please reconsult if a change in clinical status warrants re-evaluation of dosage. ? ? ? ?Height: 5\' 8"  (172.7 cm) ?Weight: 82 kg (180 lb 12.4 oz) ?IBW/kg (Calculated) : 68.4 ? ?Temp (24hrs), Avg:100.5 ?F (38.1 ?C), Min:100.5 ?F (38.1 ?C), Max:100.5 ?F (38.1 ?C) ? ?Recent Labs  ?Lab 09/27/21 ?0324  ?WBC 15.3*  ?CREATININE 0.79  ?  ?Estimated Creatinine Clearance: 100.9 mL/min (by C-G formula based on SCr of 0.79 mg/dL).   ? ?No Known Allergies ?  ? ?Thank you for allowing pharmacy to be a part of this patient?s care. ? ?Maddix Kliewer, 09/29/21, PharmD ?09/27/2021 6:04 AM ? ?

## 2021-09-27 NOTE — Transfer of Care (Signed)
Immediate Anesthesia Transfer of Care Note ? ?Patient: Hunter Hoffman ? ?Procedure(s) Performed: Incision and drainage of facial swelling, debridement of mandible and extraction of teeth #1, 3, 31, 32, 16, 17, 18, and 19 (Left) ? ?Patient Location: PACU ? ?Anesthesia Type:General ? ?Level of Consciousness: sedated, patient cooperative and responds to stimulation ? ?Airway & Oxygen Therapy: Patient Spontanous Breathing and Patient connected to face mask oxygen ? ?Post-op Assessment: Report given to RN and Post -op Vital signs reviewed and stable ? ?Post vital signs: Reviewed and stable ? ?Last Vitals:  ?Vitals Value Taken Time  ?BP 135/81 09/27/21 1818  ?Temp    ?Pulse 72 09/27/21 1822  ?Resp 13 09/27/21 1822  ?SpO2 100 % 09/27/21 1822  ?Vitals shown include unvalidated device data. ? ?Last Pain:  ?Vitals:  ? 09/27/21 1532  ?TempSrc:   ?PainSc: 0-No pain  ?   ? ?Patients Stated Pain Goal: 3 (09/27/21 0845) ? ?Complications: No notable events documented. ?

## 2021-09-28 ENCOUNTER — Encounter (HOSPITAL_COMMUNITY): Payer: Self-pay | Admitting: Oral Surgery

## 2021-09-28 ENCOUNTER — Inpatient Hospital Stay: Payer: Self-pay

## 2021-09-28 ENCOUNTER — Inpatient Hospital Stay (HOSPITAL_COMMUNITY): Payer: Self-pay

## 2021-09-28 DIAGNOSIS — D509 Iron deficiency anemia, unspecified: Secondary | ICD-10-CM

## 2021-09-28 DIAGNOSIS — M869 Osteomyelitis, unspecified: Secondary | ICD-10-CM

## 2021-09-28 DIAGNOSIS — Z720 Tobacco use: Secondary | ICD-10-CM

## 2021-09-28 DIAGNOSIS — M8448XA Pathological fracture, other site, initial encounter for fracture: Secondary | ICD-10-CM | POA: Diagnosis present

## 2021-09-28 LAB — CBC
HCT: 34.4 % — ABNORMAL LOW (ref 39.0–52.0)
Hemoglobin: 10.4 g/dL — ABNORMAL LOW (ref 13.0–17.0)
MCH: 19.9 pg — ABNORMAL LOW (ref 26.0–34.0)
MCHC: 30.2 g/dL (ref 30.0–36.0)
MCV: 65.8 fL — ABNORMAL LOW (ref 80.0–100.0)
Platelets: UNDETERMINED 10*3/uL (ref 150–400)
RBC: 5.23 MIL/uL (ref 4.22–5.81)
RDW: 15.9 % — ABNORMAL HIGH (ref 11.5–15.5)
WBC: 8.9 10*3/uL (ref 4.0–10.5)
nRBC: 0 % (ref 0.0–0.2)

## 2021-09-28 LAB — CBC WITH DIFFERENTIAL/PLATELET
Abs Immature Granulocytes: 0.09 10*3/uL — ABNORMAL HIGH (ref 0.00–0.07)
Basophils Absolute: 0.1 10*3/uL (ref 0.0–0.1)
Basophils Relative: 0 %
Eosinophils Absolute: 0 10*3/uL (ref 0.0–0.5)
Eosinophils Relative: 0 %
HCT: 36.1 % — ABNORMAL LOW (ref 39.0–52.0)
Hemoglobin: 11.2 g/dL — ABNORMAL LOW (ref 13.0–17.0)
Immature Granulocytes: 1 %
Lymphocytes Relative: 16 %
Lymphs Abs: 2.2 10*3/uL (ref 0.7–4.0)
MCH: 20 pg — ABNORMAL LOW (ref 26.0–34.0)
MCHC: 31 g/dL (ref 30.0–36.0)
MCV: 64.5 fL — ABNORMAL LOW (ref 80.0–100.0)
Monocytes Absolute: 1.2 10*3/uL — ABNORMAL HIGH (ref 0.1–1.0)
Monocytes Relative: 8 %
Neutro Abs: 10.4 10*3/uL — ABNORMAL HIGH (ref 1.7–7.7)
Neutrophils Relative %: 75 %
Platelets: 203 10*3/uL (ref 150–400)
RBC: 5.6 MIL/uL (ref 4.22–5.81)
RDW: 15.9 % — ABNORMAL HIGH (ref 11.5–15.5)
WBC: 13.9 10*3/uL — ABNORMAL HIGH (ref 4.0–10.5)
nRBC: 0 % (ref 0.0–0.2)

## 2021-09-28 LAB — COMPREHENSIVE METABOLIC PANEL
ALT: 12 U/L (ref 0–44)
AST: 18 U/L (ref 15–41)
Albumin: 3.3 g/dL — ABNORMAL LOW (ref 3.5–5.0)
Alkaline Phosphatase: 78 U/L (ref 38–126)
Anion gap: 9 (ref 5–15)
BUN: 15 mg/dL (ref 6–20)
CO2: 25 mmol/L (ref 22–32)
Calcium: 8.5 mg/dL — ABNORMAL LOW (ref 8.9–10.3)
Chloride: 104 mmol/L (ref 98–111)
Creatinine, Ser: 0.82 mg/dL (ref 0.61–1.24)
GFR, Estimated: >60 mL/min (ref >60)
Glucose, Bld: 117 mg/dL — ABNORMAL HIGH (ref 70–99)
Potassium: 4.6 mmol/L (ref 3.5–5.1)
Sodium: 138 mmol/L (ref 135–145)
Total Bilirubin: 0.7 mg/dL (ref 0.3–1.2)
Total Protein: 7.1 g/dL (ref 6.5–8.1)

## 2021-09-28 LAB — MAGNESIUM: Magnesium: 2.1 mg/dL (ref 1.7–2.4)

## 2021-09-28 LAB — PHOSPHORUS: Phosphorus: 2.6 mg/dL (ref 2.5–4.6)

## 2021-09-28 MED ORDER — ASCORBIC ACID 500 MG PO TABS
500.0000 mg | ORAL_TABLET | Freq: Every day | ORAL | Status: DC
Start: 2021-09-28 — End: 2021-10-01
  Administered 2021-09-28 – 2021-10-01 (×4): 500 mg via ORAL
  Filled 2021-09-28 (×4): qty 1

## 2021-09-28 MED ORDER — SODIUM CHLORIDE 0.9 % IV SOLN: 1000.0000 mg | Freq: Once | INTRAVENOUS | Status: DC

## 2021-09-28 MED ORDER — HEPARIN SODIUM (PORCINE) 5000 UNIT/ML IJ SOLN
5000.0000 [IU] | Freq: Three times a day (TID) | INTRAMUSCULAR | Status: DC
  Administered 2021-09-28 – 2021-10-01 (×8): 5000 [IU] via SUBCUTANEOUS
  Filled 2021-09-28 (×7): qty 1

## 2021-09-28 MED ORDER — SODIUM CHLORIDE 0.9 % IV SOLN
250.0000 mg | Freq: Every day | INTRAVENOUS | Status: AC
  Administered 2021-09-28 – 2021-10-01 (×4): 250 mg via INTRAVENOUS
  Filled 2021-09-28 (×4): qty 20

## 2021-09-28 MED ORDER — FERROUS SULFATE 325 (65 FE) MG PO TABS
325.0000 mg | ORAL_TABLET | Freq: Every day | ORAL | Status: DC
  Administered 2021-09-29 – 2021-10-01 (×3): 325 mg via ORAL
  Filled 2021-09-28 (×3): qty 1

## 2021-09-28 NOTE — Progress Notes (Addendum)
Pt had stated earlier today that he wanted to leave AMA but never signed the form. After much discussion w MD, CN, AC and DMD pt decided that he will indeed stay for 2-3 more days of IV abx. He stated that he will consent to a PICC line and will also consent to IV abx at home. TOC referral renewed/updated to include the latest. ?

## 2021-09-28 NOTE — Progress Notes (Signed)
?PROGRESS NOTE ? ? ? ?Hunter Hoffman  NWG:[id number] DOB: 05-09-1966 DOA: 09/27/2021 ?PCP: Pcp, No  ? ? ? ?Brief Narrative:  ?56 y.o. male PMHx PTSD.  ? ?Presenting with left facial swelling. Symptoms started about a month ago. He first noticed that his tooth filling came out and there was a crack in his back tooth. He denies any trauma to the area. It wasn't particularly painful at the time. He tried some salt water gargles and rinsing with H2O2, but they didn't seem to help. He was able to largely ignore the area until it became sharply painful over these last two days. He denies any fever, N/V, headache, visual changes, or drainage. ?  ?Review of Systems: As mentioned in the history of present illness. All other systems reviewed and are negative. ? ? ?Subjective: ?Afebrile overnight, A/O x4.  Combative, VERY POOR understanding of how serious his current condition is regarding infection and pathological fracture of mandible ? ? ?Assessment & Plan: ?Covid vaccination; ?  ?Principal Problem: ?  Acute osteomyelitis of jaw ?Active Problems: ?  Microcytic anemia ?  SIRS (systemic inflammatory response syndrome) (HCC) ?  Tobacco abuse ?  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 ?Acute osteomyelitis of the left jaw ?- DMD Dorris Singh offered: ?Conservative approach including MMF, long-term outpatient antibiotics (possible PICC line) and close follow up to assess if he is able to heal but still likely that this approach will be insufficient; ?-5/16 patient finally excepted this approach late in the afternoon (1730). ?-5/16 consult to PICC team ?-5/16 reconsult ID in A.m. how many weeks will patient require IV antibiotics ?- 5/16 consulted TOC: Patient will require long-term IV antibiotics ?- 5/16 DMD Dorris Singh has offered to facilitate obtaining patient appointment for definitive care at [facility] prior to discharge. ?-5/16 full liquid diet.  Patient has FRACTURED MANDIBLE ?-Unasyn 3 g QID ? ?Pathological  fracture of LEFT mandible ?- DMD Dorris Singh offered: ?   -Definitive approach including jaw resection, placement of reconstruction plate followed later by reconstruction with vascularized vs nonvascularized bone graft; this is beyond my scope of practice and I would recommend patient be referred to [facility] for such workup and treatment; patient is considering this option discussed with him possible sequela of no treatment or failed treatment including worsening/spreading osteomyelitis and sepsis which he understands ?-5/16 patient declined. ? ?SIRS ?-See osteomyelitis left jaw ?  ?Microcytic anemia ?-5/16 severe iron deficiency ?- 5/16 iron infusion 1000 mg x 1.  Iron p.o. 325 mg daily starting in Am ?-5/16 vitamin C 500 mg daily ?  ?Tobacco abuse ?    - counsel against further use ?  ? ? ?  ? ? ?Mobility Assessment (last 72 hours)   ? ? Mobility Assessment   ? ? Row Name 09/28/21 0746 09/27/21 2123 09/27/21 0845  ?  ?  ? Does patient have an order for bedrest or is patient medically unstable No - Continue assessment No - Continue assessment No - Continue assessment    ? What is the highest level of mobility based on the progressive mobility assessment? Level 6 (Walks independently in room and hall) - Balance while walking in room without assist - Complete Level 5 (Walks with assist in room/hall) - Balance while stepping forward/back and can walk in room with assist - Complete Level 6 (Walks independently in room and hall) - Balance while walking in room without assist - Complete    ? ?  ?  ? ?  ? ? ? ? ?   ?  DVT prophylaxis: Subcu heparin ?Code Status: Full ?Family Communication:  ?Status is: Inpatient ? ? ? ?Dispo: The patient is from: Home ?             Anticipated d/c is to: Home ?             Anticipated d/c date is: 3 days ?             Patient currently is not medically stable to d/c. ? ? ? ? ? ?Consultants:  ?DMD Dorris Singhonnor Sherwood ? ?Procedures/Significant Events:  ? ? ?I have personally reviewed and  interpreted all radiology studies and my findings are as above. ? ?VENTILATOR SETTINGS: ? ? ? ?Cultures ? ? ?Antimicrobials: ?Anti-infectives (From admission, onward)  ? ? Start     Ordered Stop  ? 09/27/21 1000  Ampicillin-Sulbactam (UNASYN) 3 g in sodium chloride 0.9 % 100 mL IVPB       ? 09/27/21 0603    ? 09/27/21 0330  Ampicillin-Sulbactam (UNASYN) 3 g in sodium chloride 0.9 % 100 mL IVPB       ? 09/27/21 0323 09/27/21 0631  ? ?  ?  ? ? ?Devices ?  ? ?LINES / TUBES:  ? ? ? ? ?Continuous Infusions: ? ampicillin-sulbactam (UNASYN) IV 3 g (09/28/21 0341)  ? ? ? ?Objective: ?Vitals:  ? 09/27/21 2137 09/27/21 2234 09/28/21 0121 09/28/21 0500  ?BP: 124/69 139/75 137/70 139/86  ?Pulse: 62 (!) 59 (!) 55 (!) 59  ?Resp: 18 18 18 20   ?Temp: 98.3 ?F (36.8 ?C) 98.3 ?F (36.8 ?C) (!) 97.5 ?F (36.4 ?C) 97.7 ?F (36.5 ?C)  ?TempSrc: Axillary Axillary Axillary Axillary  ?SpO2: 100% 100% 100% 99%  ?Weight:      ?Height:      ? ? ?Intake/Output Summary (Last 24 hours) at 09/28/2021 [id number] ?Last data filed at 09/28/2021 0605 ?Gross per 24 hour  ?Intake 3392.88 ml  ?Output 1570 ml  ?Net 1822.88 ml  ? ?Filed Weights  ? 09/27/21 0552 09/27/21 1532  ?Weight: 82 kg 82 kg  ? ? ?Examination: ? ?General: A/O x4, No acute respiratory distress ?Eyes: negative scleral hemorrhage, negative anisocoria, negative icterus ?ENT: Negative Runny nose, negative gingival bleeding, LEFT jaw swelling, tender to palpation ?Neck:  Negative scars, masses, torticollis, lymphadenopathy, JVD ?Lungs: Clear to auscultation bilaterally without wheezes or crackles ?Cardiovascular: Regular rate and rhythm without murmur gallop or rub normal S1 and S2 ?Abdomen: negative abdominal pain, nondistended, positive soft, bowel sounds, no rebound, no ascites, no appreciable mass ?Extremities: No significant cyanosis, clubbing, or edema bilateral lower extremities ?Skin: Negative rashes, lesions, ulcers ?Psychiatric:  Negative depression, negative anxiety, negative fatigue,  negative mania  ?Central nervous system:  Cranial nerves II through XII intact, tongue/uvula midline, all extremities muscle strength 5/5, sensation intact throughout, negative dysarthria, negative expressive aphasia, negative receptive aphasia. ? ?.  ? ? ? ?Data Reviewed: Care during the described time interval was provided by me .  I have reviewed this patient's available data, including medical history, events of note, physical examination, and all test results as part of my evaluation. ? ?CBC: ?Recent Labs  ?Lab 09/27/21 ?0324 09/28/21 ?0451  ?WBC 15.3* 8.9  ?HGB 11.6* 10.4*  ?HCT 36.1* 34.4*  ?MCV 64.7* 65.8*  ?PLT PLATELET CLUMPS NOTED ON SMEAR, UNABLE TO ESTIMATE PLATELET CLUMPS NOTED ON SMEAR, UNABLE TO ESTIMATE  ? ?Basic Metabolic Panel: ?Recent Labs  ?Lab 09/27/21 ?0324 09/28/21 ?0451  ?NA 139 138  ?K 3.9 4.6  ?CL 106 104  ?  CO2 27 25  ?GLUCOSE 112* 117*  ?BUN 12 15  ?CREATININE 0.79 0.82  ?CALCIUM 9.0 8.5*  ? ?GFR: ?Estimated Creatinine Clearance: 98.5 mL/min (by C-G formula based on SCr of 0.82 mg/dL). ?Liver Function Tests: ?Recent Labs  ?Lab 09/28/21 ?0451  ?AST 18  ?ALT 12  ?ALKPHOS 78  ?BILITOT 0.7  ?PROT 7.1  ?ALBUMIN 3.3*  ? ?No results for input(s): LIPASE, AMYLASE in the last 168 hours. ?No results for input(s): AMMONIA in the last 168 hours. ?Coagulation Profile: ?No results for input(s): INR, PROTIME in the last 168 hours. ?Cardiac Enzymes: ?No results for input(s): CKTOTAL, CKMB, CKMBINDEX, TROPONINI in the last 168 hours. ?BNP (last 3 results) ?No results for input(s): PROBNP in the last 8760 hours. ?HbA1C: ?No results for input(s): HGBA1C in the last 72 hours. ?CBG: ?No results for input(s): GLUCAP in the last 168 hours. ?Lipid Profile: ?No results for input(s): CHOL, HDL, LDLCALC, TRIG, CHOLHDL, LDLDIRECT in the last 72 hours. ?Thyroid Function Tests: ?No results for input(s): TSH, T4TOTAL, FREET4, T3FREE, THYROIDAB in the last 72 hours. ?Anemia Panel: ?Recent Labs  ?  09/27/21 ?0818  ?TIBC  197*  ?IRON 18*  ? ?Sepsis Labs: ?Recent Labs  ?Lab 09/27/21 ?0805 09/27/21 ?1037  ?LATICACIDVEN 0.8 0.7  ? ? ?Recent Results (from the past 240 hour(s))  ?Surgical PCR screen     Status: None  ? Collection Time

## 2021-09-28 NOTE — Discharge Summary (Deleted)
? ? ? ?                                               Against Medical Advice ?Patient at this time expresses desire to leave the Hospital immidiately, patient has been warned that this is not Medically advisable at this time, and can result in Medical complications like Death and Disability, patient understands and accepts the risks involved and assumes full responsibilty of this decision. ? ?This patient has also been advised that if they feel the need for further medical assistance to return to any available ER or dial [phone]. ? ?Informed by Nursing staff that this patient has left care and has signed the form  Against Medical Advice on 09/28/2021 at 2:05 PM ? ?Dr Lyda Jester, Joseph Art ?[facility] ?[place] ? ?  ?

## 2021-09-28 NOTE — Op Note (Addendum)
PRE-OPERATIVE DIAGNOSIS:  Acute osteomyelitis of the mandible ?POST-OPERATIVE DIAGNOSIS:  Same with pathologic fracture of the mandible ?  ?SURGEON:  Surgeon(s) and Role: ?   Ross Marcus, Lavell Anchors, DMD - Primary ?  ?Procedure: ?Surgical removal #1, 3, 16, 17-19, 31, 32  ?I&D of masticator space  ?I&D of buccal space ?Debridement of the mandible ?  ?Description of Operation/Procedure: ?  ?The patient was encountered in [facility] OR Room 6.  General anesthesia was induced and an oral ETT was secured in the standard fashion.  The table was moved slightly away from anesthesia and the patient was properly padded, relieving all pressure points.  A formal time-out was executed.  2% lidocaine with 1:100,000 epinephrine was infiltrated into the proposed surgical sites.  The patient was prepped and draped in the standard sterile fashion and a throat pack was placed.   While opening the patient's mouth it immediately became clear there was a pathologic fracture between teeth #18 and 19 which became displaced with jaw opening.            ?  ?  ?Attention was directed to #1.  A FTMPF was reflected as necessary, bone was removed around the tooth which was removed surgically without complication.  Bone was smoothed as necessary, and the site was curetted and irrigated.  The site was closed. An identical procedure was completed for #3, 16, 31 and 32. ?  ?Attention was then directed intraorally to the lower left.  A full thickness mucoperiosteal flap was elevated on both the buccal and lingual to include the laceration overlying his mandibular fracture.  Blunt dissection was carried to the buccal and masticator spaces yielding purulence.. This was cultured sent for aerobic and anaerobic cultures. Teeth #17-19 were removed with standard elevator and forceps technique without complication. The extraction sites were thoroughly curetted, bone was smoothed, and the sites thoroughly irrigated. The mandible was confirmed to have a pathologic  fracture that was situated between teeth #18 and 19. There was an extensive amount of loose bony sequestra and granulation tissue throughout the left mandibular body. A conservative debridement was performed and samples of this hard/soft tissue was submitted for both micro and pathological review. Due to the severity of his disease and lack of sound bone simple ORIF of the left body fracture was not a viable option. The fracture was reduced and was anatomically stable. Maxillomandibular fixation was deferred until further treatment options and plans could be made with the patient.  Areas were thoroughly irrigated.  The full thickness mucoperiosteal flap was reapproximated with 3-0 chromic gut suture.  The oral cavity was suctioned free of all debris and secretions.  The throat pack was removed and an orogastric tube was passed to evacuate the stomach contents.  Sponge and needle counts were correct x 2.  Care of the patient was turned over to the Anesthesia team for uneventful extubation and delivery of the patient to the PACU in stable condition. ?  ?ANESTHESIA:   general ?EBL:  30 mL  ?DRAINS: None ?LOCAL MEDICATIONS USED:  LIDOCAINE 2% w/ 1:100000 epi ?SPECIMEN:  Swab, bone/soft tissue - cultures for anaerobes, aerobes, and fungal as well as final path ?PLAN OF CARE: Return to floor ?PATIENT DISPOSITION:  PACU - hemodynamically stable. ?Delay start of Pharmacological VTE agent (>24hrs) due to surgical blood loss or risk of bleeding: no ?  ?OMFS Recommendations ?-Liquid diet  ?-Peridex (chlorhexidine) mouthrnise QID ?-Continue Unasyn q6hr ?-Recommend Infectious disease consult for inpatient and outpatient antibiotic regimen  recommendations ? ?Herbie Saxon, DDS ?Oral and Maxillofacial Surgeon ?[facility] [place]) ?Office # [phone] ?Cell # [phone] ? ? ?

## 2021-09-28 NOTE — Progress Notes (Addendum)
Progress Note ?  ?  ?ID: 68 yoM with acute left mandibular osteomyelitis and masticator/buccal space infection s/p I&D, debridement of mandible, and conservative debridement on 09/27/21 however found to have a pathologic fracture of the left mandibular body that is untreated currently.  ?  ?Interval History ?5/16- POD 1. Patient reports feeling better today and his swelling is decompressed. Still have firmness and pain in his jaw. ?  ?Clinical Exam: ?  Extraoral swelling is improved ?       MIO is improved, about 30 mm ?            Vestibular swelling is present intraorally ?            Oropharynx is clear ?            Intraorally sutures are intact and no purulence can be appreciated ?  ?  ?Assessment: [name] with acute left mandibular osteomyelitis and masticator/buccal space infection s/p I&D, debridement of mandible, and conservative debridement on 09/27/21 however found to have a pathologic fracture of the left mandibular body that is untreated currently.  ?  ?  ?Plan: ?-Appreciate medicine's management of patient's systemic health ? -- Patient would like imaging/workup for a history of kidney cancer while he is inpatient that he was reportedly diagnosed with many years ago and treated with alternative measures ?  ? ? OMFS Recommendations ?-Liquid diet indefinitely until definitive care has been delivered ?-Will obtain CT maxface to document pathologic fracture/establish baseline and aid treatment planning if patient elects to move forward with further treatment ?-Spoke with patient about finding of pathologic fracture and offered 2 treatment options ? - Conservative approach including MMF, long-term outpatient antibiotics (possible PICC line) and close follow up to assess if he is able to heal but still likely that this approach will be insufficient; patient is not interested in this option ? -Definitive approach including jaw resection, placement of reconstruction plate followed later by reconstruction with  vascularized vs nonvascularized bone graft; this is beyond my scope of practice and I would recommend patient be referred to [facility] for such workup and treatment; patient is considering this option discussed with him possible sequela of no treatment or failed treatment including worsening/spreading osteomyelitis and sepsis which he understands ?-I recommend infectious disease consult as well to at least optimize outpatient abx regimen; if patient continues to opt against maxillomandibular fixation then he can be discharged from OMFS perspective with recommended follow up at [facility] oral surgery clinics as mentioned above ? ?Addendum -- Patient this afternoon wishes to discharge with antibiotics; from an oral surgery standpoint I am okay with this (and would recommend 6 weeks of Augmentin BID at this time) as I believe he will need a higher level of care for definitive treatment and referred patient to [facility] or [facility] Oral surgery department. He does not need to be hospitalized for control of his acute exacerbation /facial swelling and can discharge from that standpoint; the osteomyelitis of his mandible with pathological fracture is now a more complex and chronic issue that will not be completely resolved during this admission ? ?  ?Hunter Hoffman, DDS ?Oral and Maxillofacial Surgeon ?[facility]) ?Office # [phone] ?Cell # [phone]  ?

## 2021-09-29 ENCOUNTER — Inpatient Hospital Stay (HOSPITAL_COMMUNITY): Payer: Self-pay

## 2021-09-29 LAB — COMPREHENSIVE METABOLIC PANEL
ALT: 15 U/L (ref 0–44)
AST: 17 U/L (ref 15–41)
Albumin: 3.1 g/dL — ABNORMAL LOW (ref 3.5–5.0)
Alkaline Phosphatase: 71 U/L (ref 38–126)
Anion gap: 6 (ref 5–15)
BUN: 16 mg/dL (ref 6–20)
CO2: 29 mmol/L (ref 22–32)
Calcium: 8.5 mg/dL — ABNORMAL LOW (ref 8.9–10.3)
Chloride: 104 mmol/L (ref 98–111)
Creatinine, Ser: 0.83 mg/dL (ref 0.61–1.24)
GFR, Estimated: >60 mL/min (ref >60)
Glucose, Bld: 95 mg/dL (ref 70–99)
Potassium: 4 mmol/L (ref 3.5–5.1)
Sodium: 139 mmol/L (ref 135–145)
Total Bilirubin: 0.5 mg/dL (ref 0.3–1.2)
Total Protein: 6.9 g/dL (ref 6.5–8.1)

## 2021-09-29 LAB — CBC WITH DIFFERENTIAL/PLATELET
Abs Immature Granulocytes: 0.04 10*3/uL (ref 0.00–0.07)
Basophils Absolute: 0.1 10*3/uL (ref 0.0–0.1)
Basophils Relative: 1 %
Eosinophils Absolute: 0 10*3/uL (ref 0.0–0.5)
Eosinophils Relative: 1 %
HCT: 31.8 % — ABNORMAL LOW (ref 39.0–52.0)
Hemoglobin: 9.9 g/dL — ABNORMAL LOW (ref 13.0–17.0)
Immature Granulocytes: 1 %
Lymphocytes Relative: 37 %
Lymphs Abs: 2.4 10*3/uL (ref 0.7–4.0)
MCH: 20.2 pg — ABNORMAL LOW (ref 26.0–34.0)
MCHC: 31.1 g/dL (ref 30.0–36.0)
MCV: 65 fL — ABNORMAL LOW (ref 80.0–100.0)
Monocytes Absolute: 0.5 10*3/uL (ref 0.1–1.0)
Monocytes Relative: 8 %
Neutro Abs: 3.4 10*3/uL (ref 1.7–7.7)
Neutrophils Relative %: 52 %
Platelets: 169 10*3/uL (ref 150–400)
RBC: 4.89 MIL/uL (ref 4.22–5.81)
RDW: 15.8 % — ABNORMAL HIGH (ref 11.5–15.5)
WBC: 6.4 10*3/uL (ref 4.0–10.5)
nRBC: 0 % (ref 0.0–0.2)

## 2021-09-29 LAB — MAGNESIUM: Magnesium: 2.3 mg/dL (ref 1.7–2.4)

## 2021-09-29 LAB — PHOSPHORUS: Phosphorus: 2.8 mg/dL (ref 2.5–4.6)

## 2021-09-29 LAB — SURGICAL PATHOLOGY

## 2021-09-29 NOTE — TOC Progression Note (Signed)
Transition of Care (TOC) - Progression Note  ? ?Patient Details  ?Name: Hunter Hoffman ?MRN: [id number] ?Date of Birth: 05/11/1966 ? ?Transition of Care (TOC) CM/SW Contact  ?Ewing Schlein, LCSW ?Phone Number: ?09/29/2021, 2:25 PM ? ?Clinical Narrative: CSW spoke with patient and patient reported he has Medicare A&B and is not uninsured, but does not have a PCP. Patient will discharge home on several weeks of IV antibiotics and will need a PCP for follow up care and possibly signing HH orders. Patient is agreeable to hospital follow up appointment at [facility] Internal [facility]. Patient also agreeable to referrals for IV antibiotics and HHRN. ? ?CSW made referral to [facility] with [facility] for IV antibiotics. HHRN referral will be made once the patient's insurance is updated by registration. CSW called [facility] to schedule follow up appointment. Patient will see [name] on Oct 05, 2021 at 8:45am.  ? ?Expected Discharge Plan: Home/Self Care ?Barriers to Discharge: Inadequate or no insurance ? ?Expected Discharge Plan and Services ?Expected Discharge Plan: Home/Self Care ?Post Acute Care Choice: NA ?Living arrangements for the past 2 months: Single Family Home           ?DME Arranged: N/A ?DME Agency: NA ? ?Readmission Risk Interventions ?   ? View : No data to display.  ?  ?  ?  ? ?

## 2021-09-29 NOTE — Progress Notes (Signed)
?   ? ?[place] for Infectious Disease ? ?Date of Admission:  09/27/2021    ?       ?Reason for visit: Follow up on mandibular osteomyelitis ? ?Current antibiotics: ?Unasyn 5/15--present ? ?ASSESSMENT:   ? ?56 y.o. male admitted with: ? ?Osteomyelitis of the mandible: Admission CT scan showed permeative destruction of the left mandible with extensive swelling and ill-defined necrotic or purulent low-density along the outer surface of the mandible in the buccal and masticator spaces concerning for acute osteomyelitis.  He was taken to the operating room on 09/27/2021 with oral surgery.  He was at that time noted to have a pathologic fracture as well.  Treatment options were discussed with patient including a more conservative approach of MMF versus definitive approach including resection and reconstruction.  Dentistry is recommending that he follow-up at [facility] or [facility] oral surgery department for further care given the complexity.  His operative cultures at this time are no growth. ? ?RECOMMENDATIONS:   ? ?Continue Unasyn ?PICC line ?Follow cultures ?Lab monitoring ?Will follow ? ? ?Principal Problem: ?  Acute osteomyelitis of jaw ?Active Problems: ?  Microcytic anemia ?  SIRS (systemic inflammatory response syndrome) (HCC) ?  Tobacco abuse ?  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 ? ? ?MEDICATIONS:   ? ?Scheduled Meds: ? vitamin C  500 mg Oral Daily  ? ferrous sulfate  325 mg Oral Q breakfast  ? heparin injection (subcutaneous)  5,000 Units Subcutaneous Q8H  ? ?Continuous Infusions: ? ampicillin-sulbactam (UNASYN) IV Stopped (09/29/21 9735)  ? ferric gluconate (FERRLECIT) IVPB 250 mg (09/29/21 3299)  ? ?PRN Meds:.ketorolac, ondansetron **OR** ondansetron (ZOFRAN) IV ? ?SUBJECTIVE:  ? ?24 hour events:  ?No acute events. ?Patient reports there was a misunderstanding regarding wanting to leave AMA but that he is agreeable to IV antibiotics and PICC line ?No fevers ?Swelling improved ?Pain  controlled ? ? ? ?Review of Systems  ?All other systems reviewed and are negative. ? ?  ?OBJECTIVE:  ? ?Blood pressure 137/79, pulse (!) 51, temperature 98.7 ?F (37.1 ?C), temperature source Oral, resp. rate 16, height 5' 8"  (1.727 m), weight 82 kg, SpO2 98 %. ?Body mass index is 27.49 kg/m?. ? ?Physical Exam ?Constitutional:   ?   General: He is not in acute distress. ?   Appearance: Normal appearance.  ?HENT:  ?   Head:  ?   Comments: Left-sided facial and jaw swelling has improved ?Eyes:  ?   Extraocular Movements: Extraocular movements intact.  ?   Conjunctiva/sclera: Conjunctivae normal.  ?Pulmonary:  ?   Effort: Pulmonary effort is normal. No respiratory distress.  ?Abdominal:  ?   General: There is no distension.  ?   Palpations: Abdomen is soft.  ?Musculoskeletal:     ?   General: Normal range of motion.  ?   Cervical back: Normal range of motion and neck supple.  ?Skin: ?   General: Skin is warm and dry.  ?   Findings: No rash.  ?Neurological:  ?   General: No focal deficit present.  ?   Mental Status: He is alert and oriented to person, place, and time.  ?Psychiatric:     ?   Mood and Affect: Mood normal.     ?   Behavior: Behavior normal.  ? ? ? ?Lab Results: ?Lab Results  ?Component Value Date  ? WBC 6.4 09/29/2021  ? HGB 9.9 (L) 09/29/2021  ? HCT 31.8 (L) 09/29/2021  ? MCV 65.0 (  L) 09/29/2021  ? PLT 169 09/29/2021  ?  ?Lab Results  ?Component Value Date  ? NA 139 09/29/2021  ? K 4.0 09/29/2021  ? CO2 29 09/29/2021  ? GLUCOSE 95 09/29/2021  ? BUN 16 09/29/2021  ? CREATININE 0.83 09/29/2021  ? CALCIUM 8.5 (L) 09/29/2021  ? GFRNONAA >60 09/29/2021  ? GFRAA  05/13/2009  ?  >60        ?The eGFR has been calculated ?using the MDRD equation. ?This calculation has not been ?validated in all clinical ?situations. ?eGFR's persistently ?<60 mL/min signify ?possible Chronic Kidney Disease.  ?  ?Lab Results  ?Component Value Date  ? ALT 15 09/29/2021  ? AST 17 09/29/2021  ? ALKPHOS 71 09/29/2021  ? BILITOT 0.5  09/29/2021  ? ? ?No results found for: CRP ? ?   ?Component Value Date/Time  ? ESRSEDRATE 4 04/22/2008 0000  ? ?  ?I have reviewed the micro and lab results in Epic. ? ?Imaging: ?[place] EKG SITE RITE ? ?Result Date: 09/28/2021 ?If [employer] not attached, placement could not be confirmed due to current cardiac rhythm. ? ?CT MAXILLOFACIAL WO CONTRAST ? ?Result Date: 09/28/2021 ?CLINICAL DATA:  Mandible osteomyelitis EXAM: CT MAXILLOFACIAL WITHOUT CONTRAST TECHNIQUE: Multidetector CT imaging of the maxillofacial structures was performed. Multiplanar CT image reconstructions were also generated. RADIATION DOSE REDUCTION: This exam was performed according to the departmental dose-optimization program which includes automated exposure control, adjustment of the mA and/or kV according to patient size and/or use of iterative reconstruction technique. COMPARISON:  Maxillofacial CT dated 1 day prior FINDINGS: Osseous: There has been interval extraction of multiple mandibular teeth. There is permeative destruction of the left hemi mandible extending from the body throughout the coronoid process to the neck of the mandibular condyle consistent with osteomyelitis. There is a nondisplaced pathologic fracture of the body of the mandible which violates the mandibular foramen (7-42). There is marked swelling in the overlying soft tissues of the left face with asymmetric thickening of the platysma and muscles of mastication. The previously seen peripheral enhancement is not appreciated in the absence of intravenous contrast. The mandibular condyles are normally situated. Carious lesions are noted involving multiple of the remaining teeth. Orbits: The globes and orbits are unremarkable. Sinuses: The paranasal sinuses are clear. Soft tissues: Extensive soft tissue swelling of the left face as above. Limited intracranial: Imaged portions of the intracranial compartment are unremarkable. IMPRESSION: 1. Permeative destruction of the left  hemimandible as above remains most consistent with osteomyelitis. Significant overlying soft tissue swelling and thickening of the adjacent musculature is similar to the prior study. 2. Nondisplaced fracture through the body of the mandible with violation of the mandibular foramen. 3. Interval extraction of multiple mandibular teeth. Electronically Signed   By: Valetta Mole M.D.   On: 09/28/2021 11:19    ? ?Imaging independently reviewed in Epic.  ? ? ?Mignon Pine ?[place] for Infectious Disease ?[place] ?[phone] pager ?09/29/2021, 11:32 AM ? ? ?I have personally spent 35 minutes involved in face-to-face and non-face-to-face activities for this patient on the day of the visit. Professional time spent includes the following activities: Preparing to see the patient (review of tests), Obtaining and/or reviewing separately obtained history (admission/discharge record), Performing a medically appropriate examination and/or evaluation , Ordering medications/tests/procedures, referring and communicating with other health care professionals, Documenting clinical information in the EMR, Independently interpreting results (not separately reported), Communicating results to the patient/family/caregiver, Counseling and educating the patient/family/caregiver and Care  coordination (not separately reported).  ? ?

## 2021-09-29 NOTE — Progress Notes (Signed)
IV Team attempted to place patients PICC line but patient requesting to take a shower first. IV team attempted to come back and place PICC but patient refused stating he would rather have it placed tomorrow after he takes a shower. Patient made aware that IV team works on schedule but patient continuing to refuse. IV team made aware of patients request.   ?

## 2021-09-29 NOTE — Progress Notes (Signed)
Went to place PICC, patient wants to take a bath first before placing PICC line. Primary RN at bedside, aware he will be in the bottom of our PICC list. Per patient and RN, will be discharged Friday 5/19. Will follow up. ?

## 2021-09-29 NOTE — Progress Notes (Signed)
?Progress Note ? ? ?Patient: Hunter Hoffman [id number] DOB: Aug 16, 1965 DOA: 09/27/2021     2 ?DOS: the patient was seen and examined on 09/29/2021 ?  ?Brief hospital course: ?56 y.o. male PMHx PTSD and reported remote hx of L sided renal cell ca who presented with left facial swelling. Symptoms started about a month ago. He first noticed that his tooth filling came out and there was a crack in his back tooth. He denies any trauma to the area. It wasn't particularly painful at the time. He tried some salt water gargles and rinsing with H2O2, but they didn't seem to help. He was able to largely ignore the area until it became sharply painful over these last two days. He denies any fever, N/V, headache, visual changes, or drainage. ? ?Assessment and Plan: ?No notes have been filed under this hospital service. ?Service: Hospitalist ?Acute osteomyelitis of the left jaw ?- [name] offered: ?Conservative approach including MMF, long-term outpatient antibiotics (possible PICC line) and close follow up to assess if he is able to heal but still likely that this approach will be insufficient; ?-ID following, planning PICC placement with continued unasyn for now ?-  [name] has offered to facilitate obtaining patient appointment for definitive care at [facility] prior to discharge. ?-Continued on full liquid diet secondary to FRACTURED MANDIBLE ?  ?Pathological fracture of LEFT mandible ?- [name] offered: ?              -Definitive approach including jaw resection, placement of reconstruction plate followed later by reconstruction with vascularized vs nonvascularized bone graft; this is beyond my scope of practice and I would recommend patient be referred to [facility] for such workup and treatment; patient is considering this option discussed with him possible sequela of no treatment or failed treatment including worsening/spreading osteomyelitis and sepsis which he understands ?-5/16 patient  declined, plan for IV abx per above ?  ?SIRS ?-secondary to osteomyelitis left jaw ?  ?Microcytic anemia ?-5/16 severe iron deficiency ?- 5/16 iron infusion 1000 mg x 1.  Iron p.o. 325 mg daily starting in Am ?-5/16 vitamin C 500 mg daily ?  ?Tobacco abuse ?    - cessation done ? ?Self reported hx of renal cell CA ?-Pt reports remote hx of L sided renal lesion described as renal cancer for which pt has been treating with homeopathic remedies, such as pressure points ?-Will check renal US ?  ? ?  ? ?Subjective: Reports facial swelling is improving ? ?Physical Exam: ?Vitals:  ? 09/28/21 1415 09/28/21 2013 09/29/21 0450 09/29/21 1244  ?BP: (!) 143/84 (!) 143/89 137/79 (!) 159/98  ?Pulse: 71 63 (!) 51 66  ?Resp: 16 16 16 16   ?Temp: 98.6 ?F (37 ?C) 99.1 ?F (37.3 ?C) 98.7 ?F (37.1 ?C) 98 ?F (36.7 ?C)  ?TempSrc:  Oral Oral   ?SpO2: 99% 99% 98% 94%  ?Weight:      ?Height:      ? ?General exam: Awake, laying in bed, in nad, facial swelling improved ?Respiratory system: Normal respiratory effort, no wheezing ?Cardiovascular system: regular rate, s1, s2 ?Gastrointestinal system: Soft, nondistended, positive BS ?Central nervous system: CN2-12 grossly intact, strength intact ?Extremities: Perfused, no clubbing ?Skin: Normal skin turgor, no notable skin lesions seen ?Psychiatry: Mood normal // no visual hallucinations  ? ?Data Reviewed: ? ?Labs reviewed: Cr 0.83, Hgb 9.9 ? ?Family Communication: Pt in room, family not at bedside ? ?Disposition: ?Status is: Inpatient ?Remains inpatient appropriate because: Severity of  illness ? Planned Discharge Destination: Home ? ? ? ?Author: ?Marylu Lund, MD ?09/29/2021 3:56 PM ? ?For on call review [email or web address].  ?

## 2021-09-29 NOTE — Hospital Course (Signed)
56 y.o. male PMHx PTSD and reported remote hx of L sided renal cell ca who presented with left facial swelling. Symptoms started about a month ago. He first noticed that his tooth filling came out and there was a crack in his back tooth. He denies any trauma to the area. It wasn't particularly painful at the time. He tried some salt water gargles and rinsing with H2O2, but they didn't seem to help. He was able to largely ignore the area until it became sharply painful over these last two days. He denies any fever, N/V, headache, visual changes, or drainage. ?

## 2021-09-30 LAB — CBC WITH DIFFERENTIAL/PLATELET
Abs Immature Granulocytes: 0.03 10*3/uL (ref 0.00–0.07)
Basophils Absolute: 0.1 10*3/uL (ref 0.0–0.1)
Basophils Relative: 2 %
Eosinophils Absolute: 0.1 10*3/uL (ref 0.0–0.5)
Eosinophils Relative: 2 %
HCT: 33.9 % — ABNORMAL LOW (ref 39.0–52.0)
Hemoglobin: 10.4 g/dL — ABNORMAL LOW (ref 13.0–17.0)
Immature Granulocytes: 1 %
Lymphocytes Relative: 32 %
Lymphs Abs: 1.7 10*3/uL (ref 0.7–4.0)
MCH: 19.9 pg — ABNORMAL LOW (ref 26.0–34.0)
MCHC: 30.7 g/dL (ref 30.0–36.0)
MCV: 64.9 fL — ABNORMAL LOW (ref 80.0–100.0)
Monocytes Absolute: 0.5 10*3/uL (ref 0.1–1.0)
Monocytes Relative: 9 %
Neutro Abs: 3 10*3/uL (ref 1.7–7.7)
Neutrophils Relative %: 54 %
Platelets: 165 10*3/uL (ref 150–400)
RBC: 5.22 MIL/uL (ref 4.22–5.81)
RDW: 15.6 % — ABNORMAL HIGH (ref 11.5–15.5)
WBC: 5.4 10*3/uL (ref 4.0–10.5)
nRBC: 0.7 % — ABNORMAL HIGH (ref 0.0–0.2)

## 2021-09-30 LAB — COMPREHENSIVE METABOLIC PANEL
ALT: 14 U/L (ref 0–44)
AST: 15 U/L (ref 15–41)
Albumin: 3.4 g/dL — ABNORMAL LOW (ref 3.5–5.0)
Alkaline Phosphatase: 76 U/L (ref 38–126)
Anion gap: 6 (ref 5–15)
BUN: 16 mg/dL (ref 6–20)
CO2: 29 mmol/L (ref 22–32)
Calcium: 9.1 mg/dL (ref 8.9–10.3)
Chloride: 107 mmol/L (ref 98–111)
Creatinine, Ser: 0.89 mg/dL (ref 0.61–1.24)
GFR, Estimated: >60 mL/min (ref >60)
Glucose, Bld: 104 mg/dL — ABNORMAL HIGH (ref 70–99)
Potassium: 4.3 mmol/L (ref 3.5–5.1)
Sodium: 142 mmol/L (ref 135–145)
Total Bilirubin: 0.6 mg/dL (ref 0.3–1.2)
Total Protein: 7.3 g/dL (ref 6.5–8.1)

## 2021-09-30 LAB — PHOSPHORUS: Phosphorus: 3.3 mg/dL (ref 2.5–4.6)

## 2021-09-30 LAB — MAGNESIUM: Magnesium: 2.3 mg/dL (ref 1.7–2.4)

## 2021-09-30 MED ORDER — SODIUM CHLORIDE 0.9% FLUSH: 10.0000 mL | INTRAVENOUS | Status: DC | PRN

## 2021-09-30 MED ORDER — METRONIDAZOLE 500 MG PO TABS
500.0000 mg | ORAL_TABLET | Freq: Two times a day (BID) | ORAL | Status: DC
  Administered 2021-09-30 – 2021-10-01 (×3): 500 mg via ORAL
  Filled 2021-09-30 (×3): qty 1

## 2021-09-30 MED ORDER — SODIUM CHLORIDE 0.9% FLUSH
10.0000 mL | Freq: Two times a day (BID) | INTRAVENOUS | Status: DC
  Administered 2021-09-30: 10 mL

## 2021-09-30 MED ORDER — SODIUM CHLORIDE 0.9 % IV SOLN
2.0000 g | INTRAVENOUS | Status: DC
  Administered 2021-09-30 – 2021-10-01 (×2): 2 g via INTRAVENOUS
  Filled 2021-09-30 (×2): qty 20

## 2021-09-30 MED ORDER — CHLORHEXIDINE GLUCONATE CLOTH 2 % EX PADS
6.0000 | MEDICATED_PAD | Freq: Every day | CUTANEOUS | Status: DC
  Administered 2021-09-30 – 2021-10-01 (×2): 6 via TOPICAL

## 2021-09-30 NOTE — Anesthesia Postprocedure Evaluation (Signed)
Anesthesia Post Note  Patient: Phi Avans  Procedure(s) Performed: Incision and drainage of facial swelling, debridement of mandible and extraction of teeth #1, 3, 31, 32, 16, 17, 18, and 19 (Left)     Patient location during evaluation: PACU Anesthesia Type: General Level of consciousness: awake and alert Pain management: pain level controlled Vital Signs Assessment: post-procedure vital signs reviewed and stable Respiratory status: spontaneous breathing, nonlabored ventilation, respiratory function stable and patient connected to nasal cannula oxygen Cardiovascular status: blood pressure returned to baseline and stable Postop Assessment: no apparent nausea or vomiting Anesthetic complications: no   No notable events documented.  Last Vitals:  Vitals:   09/29/21 2102 09/30/21 0525  BP: (!) 157/88 132/79  Pulse: 67 73  Resp: 18 18  Temp: 36.9 C 36.9 C  SpO2: 99% 100%    Last Pain:  Vitals:   09/30/21 1000  TempSrc:   PainSc: 0-No pain                 Kennieth Rad

## 2021-09-30 NOTE — Progress Notes (Signed)
Progress Note   Patient: Hunter Hoffman FWY:[id number] DOB: 08/31/1965 DOA: 09/27/2021     3 DOS: the patient was seen and examined on 09/30/2021   Brief hospital course: 56 y.o. male PMHx PTSD and reported remote hx of L sided renal cell ca who presented with left facial swelling. Symptoms started about a month ago. He first noticed that his tooth filling came out and there was a crack in his back tooth. He denies any trauma to the area. It wasn't particularly painful at the time. He tried some salt water gargles and rinsing with H2O2, but they didn't seem to help. He was able to largely ignore the area until it became sharply painful over these last two days. He denies any fever, N/V, headache, visual changes, or drainage.  Assessment and Plan: No notes have been filed under this hospital service. Service: Hospitalist Acute osteomyelitis of the left jaw - DMD Dorris Singh offered: Conservative approach including MMF, long-term outpatient antibiotics (possible PICC line) and close follow up to assess if he is able to heal but still likely that this approach will be insufficient; -ID following with continued unasyn for now -  DMD Dorris Singh has offered to facilitate obtaining patient appointment for definitive care at [facility] prior to discharge. -Continued on full liquid diet secondary to FRACTURED MANDIBLE -PICC placed 5/18 -TOC assisting with arranging home abx   Pathological fracture of LEFT mandible - DMD Dorris Singh offered:               -Definitive approach including jaw resection, placement of reconstruction plate followed later by reconstruction with vascularized vs nonvascularized bone graft; this is beyond my scope of practice and I would recommend patient be referred to [facility] for such workup and treatment; patient is considering this option discussed with him possible sequela of no treatment or failed treatment including worsening/spreading osteomyelitis and sepsis  which he understands -Cont on prolonged course of IV abx   SIRS -secondary to osteomyelitis left jaw   Microcytic anemia -5/16 severe iron deficiency - 5/16 iron infusion 1000 mg x 1.  Iron p.o. 325 mg daily starting in Am -5/16 vitamin C 500 mg daily   Tobacco abuse     - cessation done  Self reported claim of renal cell CA -Pt claims remote hx of L sided renal lesion described as renal cancer for which pt has been treating with homeopathic remedies, such as pressure points -Ordered and reviewed renal US. No evidence of masses or hydronephrosis noted -would have pt f/u with PCP       Subjective: Reports facial swelling is improving  Physical Exam: Vitals:   09/29/21 1244 09/29/21 2102 09/30/21 0525 09/30/21 1258  BP: (!) 159/98 (!) 157/88 132/79 (!) 156/98  Pulse: 66 67 73 74  Resp: 16 18 18 16   Temp: 98 F (36.7 C) 98.4 F (36.9 C) 98.5 F (36.9 C) 97.8 F (36.6 C)  TempSrc:  Oral Oral   SpO2: 94% 99% 100% 96%  Weight:      Height:       General exam: Awake, laying in bed, in nad, facial swelling improved Respiratory system: Normal respiratory effort, no wheezing Cardiovascular system: regular rate, s1, s2 Gastrointestinal system: Soft, nondistended, positive BS Central nervous system: CN2-12 grossly intact, strength intact Extremities: Perfused, no clubbing Skin: Normal skin turgor, no notable skin lesions seen Psychiatry: Mood normal // no visual hallucinations   Data Reviewed:  Labs reviewed: Hgb 10.4, Cr 0.89  Family Communication: Pt  in room, family not at bedside  Disposition: Status is: Inpatient Remains inpatient appropriate because: Severity of illness  Planned Discharge Destination: Home    Author: Rickey Barbara, MD 09/30/2021 4:24 PM  For on call review [email or web address].

## 2021-09-30 NOTE — Progress Notes (Signed)
Peripherally Inserted Central Catheter Placement  The IV Nurse has discussed with the patient and/or persons authorized to consent for the patient, the purpose of this procedure and the potential benefits and risks involved with this procedure.  The benefits include less needle sticks, lab draws from the catheter, and the patient may be discharged home with the catheter. Risks include, but not limited to, infection, bleeding, blood clot (thrombus formation), and puncture of an artery; nerve damage and irregular heartbeat and possibility to perform a PICC exchange if needed/ordered by physician.  Alternatives to this procedure were also discussed.  Bard Power PICC patient education guide, fact sheet on infection prevention and patient information card has been provided to patient /or left at bedside.    PICC Placement Documentation  PICC Single Lumen 09/30/21 Right Brachial 39 cm 0 cm (Active)  Indication for Insertion or Continuance of Line Home intravenous therapies (PICC only) 09/30/21 1500  Exposed Catheter (cm) 0 cm 09/30/21 1500  Site Assessment Clean, Dry, Intact 09/30/21 1500  Line Status Flushed;Saline locked;Blood return noted 09/30/21 1500  Dressing Type Transparent;Securing device 09/30/21 1500  Dressing Status Antimicrobial disc in place 09/30/21 1500  Safety Lock Not Applicable [id number]  Line Care Connections checked and tightened 09/30/21 1500  Dressing Intervention New dressing 09/30/21 1500  Dressing Change Due 10/07/21 09/30/21 1500       Hunter Hoffman 09/30/2021, 3:21 PM

## 2021-09-30 NOTE — TOC Progression Note (Signed)
Transition of Care [facility]) - Progression Note   Patient Details  Name: Janoah Menna MRN: [id number] Date of Birth: Sep 29, 1965  Transition of Care [facility]) CM/SW Contact  Ewing Schlein, LCSW Phone Number: 09/30/2021, 2:56 PM  Clinical Narrative: CSW spoke with registration and confirmed patient's Medicare is currently inactive after being run twice. CSW followed up with patient and patient reported he was overwhelmed with completing paperwork and did not realize his Medicare was not active. Patient reported he has no other insurance such as Medicaid and he has not tried to get [place] benefits as he would prefer not to be seen at the [place].  CSW called [facility] and confirmed patient can keep his follow up appointment for next week even without insurance and will need to bring a $25.00 copay.  CSW followed up with Pam with [employer] to inform her the patient is uninsured. Patient is under review by [facility] for [facility] as this is the charity agency this week. Patient is anticipated to need 6 weeks IV antibiotics. TOC awaiting update from [facility].  Expected Discharge Plan: Home/Self Care Barriers to Discharge: Inadequate or no insurance  Expected Discharge Plan and Services Expected Discharge Plan: Home/Self Care Post Acute Care Choice: NA Living arrangements for the past 2 months: Single Family Home             DME Arranged: N/A DME Agency: NA  Readmission Risk Interventions     View : No data to display.

## 2021-10-01 ENCOUNTER — Other Ambulatory Visit (HOSPITAL_COMMUNITY): Payer: Self-pay

## 2021-10-01 LAB — CBC WITH DIFFERENTIAL/PLATELET
Abs Immature Granulocytes: 0.04 10*3/uL (ref 0.00–0.07)
Basophils Absolute: 0.1 10*3/uL (ref 0.0–0.1)
Basophils Relative: 1 %
Eosinophils Absolute: 0.2 10*3/uL (ref 0.0–0.5)
Eosinophils Relative: 3 %
HCT: 33.5 % — ABNORMAL LOW (ref 39.0–52.0)
Hemoglobin: 10.6 g/dL — ABNORMAL LOW (ref 13.0–17.0)
Immature Granulocytes: 1 %
Lymphocytes Relative: 33 %
Lymphs Abs: 2.2 10*3/uL (ref 0.7–4.0)
MCH: 20.4 pg — ABNORMAL LOW (ref 26.0–34.0)
MCHC: 31.6 g/dL (ref 30.0–36.0)
MCV: 64.4 fL — ABNORMAL LOW (ref 80.0–100.0)
Monocytes Absolute: 0.6 10*3/uL (ref 0.1–1.0)
Monocytes Relative: 9 %
Neutro Abs: 3.5 10*3/uL (ref 1.7–7.7)
Neutrophils Relative %: 53 %
Platelets: ADEQUATE 10*3/uL (ref 150–400)
RBC: 5.2 MIL/uL (ref 4.22–5.81)
RDW: 15.6 % — ABNORMAL HIGH (ref 11.5–15.5)
WBC: 6.5 10*3/uL (ref 4.0–10.5)
nRBC: 0.8 % — ABNORMAL HIGH (ref 0.0–0.2)

## 2021-10-01 LAB — COMPREHENSIVE METABOLIC PANEL
ALT: 14 U/L (ref 0–44)
AST: 18 U/L (ref 15–41)
Albumin: 3.4 g/dL — ABNORMAL LOW (ref 3.5–5.0)
Alkaline Phosphatase: 83 U/L (ref 38–126)
Anion gap: 6 (ref 5–15)
BUN: 16 mg/dL (ref 6–20)
CO2: 28 mmol/L (ref 22–32)
Calcium: 9.2 mg/dL (ref 8.9–10.3)
Chloride: 106 mmol/L (ref 98–111)
Creatinine, Ser: 1.01 mg/dL (ref 0.61–1.24)
GFR, Estimated: >60 mL/min (ref >60)
Glucose, Bld: 101 mg/dL — ABNORMAL HIGH (ref 70–99)
Potassium: 4.6 mmol/L (ref 3.5–5.1)
Sodium: 140 mmol/L (ref 135–145)
Total Bilirubin: 0.8 mg/dL (ref 0.3–1.2)
Total Protein: 7.3 g/dL (ref 6.5–8.1)

## 2021-10-01 LAB — PHOSPHORUS: Phosphorus: 3.4 mg/dL (ref 2.5–4.6)

## 2021-10-01 LAB — MAGNESIUM: Magnesium: 2.4 mg/dL (ref 1.7–2.4)

## 2021-10-01 MED ORDER — ASCORBIC ACID 500 MG PO TABS
500.0000 mg | ORAL_TABLET | Freq: Every day | ORAL | 0 refills | Status: AC
Start: 2021-10-02 — End: 2021-11-01
  Filled 2021-10-01: qty 30, 30d supply, fill #0

## 2021-10-01 MED ORDER — FERROUS SULFATE 325 (65 FE) MG PO TABS
325.0000 mg | ORAL_TABLET | Freq: Every day | ORAL | 0 refills | Status: AC
  Filled 2021-10-01: qty 30, 30d supply, fill #0

## 2021-10-01 MED ORDER — METRONIDAZOLE 500 MG PO TABS
500.0000 mg | ORAL_TABLET | Freq: Two times a day (BID) | ORAL | 0 refills | Status: AC
  Filled 2021-10-01: qty 49, 25d supply, fill #0

## 2021-10-01 MED ORDER — AMOXICILLIN-POT CLAVULANATE 875-125 MG PO TABS
1.0000 | ORAL_TABLET | Freq: Two times a day (BID) | ORAL | 0 refills | Status: AC
  Filled 2021-10-01: qty 28, 14d supply, fill #0

## 2021-10-01 MED ORDER — CEFTRIAXONE IV (FOR PTA / DISCHARGE USE ONLY): 2.0000 g | INTRAVENOUS | 0 refills | Status: AC

## 2021-10-01 MED ORDER — KETOROLAC TROMETHAMINE 10 MG PO TABS
10.0000 mg | ORAL_TABLET | Freq: Four times a day (QID) | ORAL | 0 refills | Status: AC | PRN
  Filled 2021-10-01: qty 20, 5d supply, fill #0

## 2021-10-01 NOTE — Progress Notes (Signed)
PHARMACY CONSULT NOTE FOR:  OUTPATIENT  PARENTERAL ANTIBIOTIC THERAPY (OPAT)  Indication: Jaw osteo Regimen: Rocephin 2g IV every 24 hours and Metronidazole 500 mg po every 12 hours End date: 10/25/21  IV antibiotic discharge orders are pended. To discharging provider:  please sign these orders via discharge navigator,  Select New Orders & click on the button choice - Manage This Unsigned Work.     Thank you for allowing pharmacy to be a part of this patient's care.  Georgina Pillion, PharmD, BCPS Infectious Diseases Clinical Pharmacist 10/01/2021 8:57 AM   **Pharmacist phone directory can now be found on [email or web address] ([email or web address]).  Listed under [facility].

## 2021-10-01 NOTE — TOC Transition Note (Signed)
Transition of Care [facility]) - CM/SW Discharge Note  Patient Details  Name: Hunter Hoffman MRN: [id number] Date of Birth: 07-Sep-1965  Transition of Care [facility]) CM/SW Contact:  Ewing Schlein, LCSW Phone Number: 10/01/2021, 1:27 PM  Clinical Narrative: Patient is set up with [facility] for charity [facility] and patient is setting up a payment plan for IV antibiotics with [employer]. HH orders are in. CSW updated patient that he will need to bring a $25.00 copay to his appointment at [facility] next week and the clinic will assist him with trying to get an orange card. Patient can discharge home after finishing today's dose of IV antibiotics and completing the payment plan with [employer]. Email was sent yesterday to financial counselor to have patient screened for Medicaid. TOC signing off.  Final next level of care: Home w Home Health Services Barriers to Discharge: Barriers Resolved  Patient Goals and CMS Choice Patient states their goals for this hospitalization and ongoing recovery are:: Discharge home CMS Medicare.gov Compare Post Acute Care list provided to:: Patient Choice offered to / list presented to : Patient  Discharge Plan and Services Post Acute Care Choice: NA          DME Arranged: N/A DME Agency: NA HH Arranged: RN, IV Antibiotics HH Agency: [facility], [employer] Date [facility] Agency Contacted: 09/29/21 Representative spoke with at [facility] Agency: Elita Quick ([employer]), Delila Spence)  Readmission Risk Interventions     View : No data to display.

## 2021-10-01 NOTE — Progress Notes (Signed)
[place] for Infectious Disease  Date of Admission:  09/27/2021           Reason for visit: Follow up on mandibular OM  Current antibiotics: Ceftriaxone Flagyl   ASSESSMENT:    56 y.o. male admitted with:  Osteomyelitis of the mandible: Admission CT scan showed permeative destruction of the left mandible with extensive swelling and ill-defined necrotic or purulent low-density along the outer surface of the mandible in the buccal and masticator spaces concerning for acute osteomyelitis.  He was taken to the operating room on 09/27/2021 with oral surgery.  He was at that time noted to have a pathologic fracture as well.  Treatment options were discussed with patient including a more conservative approach of MMF versus definitive approach including resection and reconstruction.  Dentistry is recommending that he follow-up at [facility] or [facility] oral surgery department for further care given the complexity.  His operative cultures showed normal flora and surgical pathology was consistent with acute OM.  RECOMMENDATIONS:    Will continue with ceftriaxone and flagyl for 4 weeks from OR through 10/25/21 via PICC line Plan to transition to Augmentin for 2 more weeks at that time See OPAT note.  Will sign off, please call as needed  Diagnosis: Mandibular OM  Culture Result: normal flora  No Known Allergies  OPAT Orders Discharge antibiotics to be given via PICC line Discharge antibiotics: Per pharmacy protocol ceftriaxone 2gm daily IV and flagyl 523m BID PO  Duration: 4 weeks End Date: 10/25/21  [facility]  [facility] health RN for IV administration and teaching; PICC line care and labs.    Labs weekly while on IV antibiotics: __xx CBC with differential __ BMP _xx_ CMP _xx_ CRP _xx_ ESR __ Vancomycin trough __ CK  xx__ Please pull PIC at completion of IV antibiotics __ Please leave PIC in place until doctor has seen patient or been notified  Fax weekly labs to  [phone] Clinic Follow Up Appt: 10/22/21 at 3:30pm with WJuleen China   Principal Problem:   Acute osteomyelitis of jaw Active Problems:   Microcytic anemia   SIRS (systemic inflammatory response syndrome) (HCC)   Tobacco abuse   Pathological fracture of left half of mandible    MEDICATIONS:    Scheduled Meds:  vitamin C  500 mg Oral Daily   Chlorhexidine Gluconate Cloth  6 each Topical Daily   ferrous sulfate  325 mg Oral Q breakfast   heparin injection (subcutaneous)  5,000 Units Subcutaneous Q8H   metroNIDAZOLE  500 mg Oral Q12H   sodium chloride flush  10-40 mL Intracatheter Q12H   Continuous Infusions:  cefTRIAXone (ROCEPHIN)  IV 2 g (09/30/21 1140)   ferric gluconate (FERRLECIT) IVPB 250 mg (10/01/21 0847)   PRN Meds:.ketorolac, ondansetron **OR** ondansetron (ZOFRAN) IV, sodium chloride flush  SUBJECTIVE:   24 hour events:  No events   Ready to go home PICC in place Discussed abx Has questions re: oral care and dentistry follow up  Review of Systems  All other systems reviewed and are negative.    OBJECTIVE:   Blood pressure (!) 135/58, pulse 69, temperature 98.6 F (37 C), temperature source Oral, resp. rate 14, height 5' 8"  (1.727 m), weight 82 kg, SpO2 100 %. Body mass index is 27.49 kg/m.  Physical Exam Constitutional:      Appearance: Normal appearance.  HENT:     Head: Normocephalic and atraumatic.     Comments: Left sided swellling markedly  improved.  Eyes:     Extraocular Movements: Extraocular movements intact.     Conjunctiva/sclera: Conjunctivae normal.  Pulmonary:     Effort: Pulmonary effort is normal. No respiratory distress.  Abdominal:     General: There is no distension.     Palpations: Abdomen is soft.  Musculoskeletal:        General: Normal range of motion.     Comments: PICC in place.  Skin:    General: Skin is warm and dry.  Neurological:     General: No focal deficit present.     Mental Status: He is alert and  oriented to person, place, and time.  Psychiatric:        Mood and Affect: Mood normal.        Behavior: Behavior normal.     Lab Results: Lab Results  Component Value Date   WBC 6.5 10/01/2021   HGB 10.6 (L) 10/01/2021   HCT 33.5 (L) 10/01/2021   MCV 64.4 (L) 10/01/2021   PLT  10/01/2021    PLATELET CLUMPS NOTED ON SMEAR, COUNT APPEARS ADEQUATE    Lab Results  Component Value Date   NA 140 10/01/2021   K 4.6 10/01/2021   CO2 28 10/01/2021   GLUCOSE 101 (H) 10/01/2021   BUN 16 10/01/2021   CREATININE 1.01 10/01/2021   CALCIUM 9.2 10/01/2021   GFRNONAA >60 10/01/2021   GFRAA  05/13/2009    >60        The eGFR has been calculated using the MDRD equation. This calculation has not been validated in all clinical situations. eGFR's persistently <60 mL/min signify possible Chronic Kidney Disease.    Lab Results  Component Value Date   ALT 14 10/01/2021   AST 18 10/01/2021   ALKPHOS 83 10/01/2021   BILITOT 0.8 10/01/2021    No results found for: CRP     Component Value Date/Time   ESRSEDRATE 4 04/22/2008 0000     I have reviewed the micro and lab results in Epic.  Imaging: US RENAL  Result Date: 09/29/2021 CLINICAL DATA:  Remote history of renal cell carcinoma. EXAM: RENAL / URINARY TRACT ULTRASOUND COMPLETE COMPARISON:  None. FINDINGS: Right Kidney: Renal measurements: 11.5 x 3.9 x 5.6 cm = volume: 130 mL. Echogenicity within normal limits. No mass or hydronephrosis visualized. Left Kidney: Renal measurements: 9.0 x 5.3 x 5.8 cm = volume: 142 mL. Echogenicity within normal limits. No mass or hydronephrosis visualized. Bladder: Appears normal for degree of bladder distention. Other: Examination is technically limited secondary to overlying bowel gas. IMPRESSION: 1. Normal renal ultrasound. Note is made that small solid renal lesions can not be excluded by ultrasound. If there is high clinical concern, recommend further evaluation with MRI. Electronically Signed   By:  Ronney Asters M.D.   On: 09/29/2021 17:23     Imaging independently reviewed in Epic.    Raynelle Highland for Infectious Disease [facility] [phone] pager 10/01/2021, 8:55 AM

## 2021-10-01 NOTE — Progress Notes (Signed)
Discharge instructions given to patient and all questions were answered.  

## 2021-10-01 NOTE — Discharge Summary (Signed)
Physician Discharge Summary   Patient: Hunter Hoffman MRN: [id number] DOB: Jul 22, 1965  Admit date:     09/27/2021  Discharge date: 10/01/21  Discharge Physician: Hunter Hoffman   PCP: Hunter Hoffman   Recommendations at discharge:    Follow up with PCP in 1-2 weeks Recommend referral to either [facility] or [facility] to follow up on jaw osteomyelitis Continue routine PICC care and d/c PICC after completing IV abx on 10/25/21  Discharge Diagnoses: Principal Problem:   Acute osteomyelitis of jaw Active Problems:   Microcytic anemia   SIRS (systemic inflammatory response syndrome) (HCC)   Tobacco abuse   Pathological fracture of left half of mandible  Resolved Problems:   * Hoffman resolved hospital problems. *  Hospital Course: 56 y.o. male PMHx PTSD and reported remote hx of L sided renal cell ca who presented with left facial swelling. Symptoms started about a month ago. He first noticed that his tooth filling came out and there was a crack in his back tooth. He denies any trauma to the area. It wasn't particularly painful at the time. He tried some salt water gargles and rinsing with H2O2, but they didn't seem to help. He was able to largely ignore the area until it became sharply painful over these last two days. He denies any fever, N/V, headache, visual changes, or drainage.  Assessment and Plan: Hoffman notes have been filed under this hospital service. Service: Hospitalist Acute osteomyelitis of the left jaw - Hunter Hoffman offered: Conservative approach including MMF, long-term outpatient antibiotics (possible PICC line) and close follow up to assess if he is able to heal but still likely that this approach will be insufficient; -ID following and pt had been continued on unasyn -  Hunter Hoffman has offered to facilitate obtaining patient appointment for definitive care at [facility] prior to discharge. -Continued on full liquid diet secondary to [place] -PICC placed  5/18 -ID had recommended rocephin and flagyl through 10/25/21 then transition to augmentin x 2 more weeks at that time -Discussed with Hunter who recommends referral to [facility] or [facility] for follow up   Pathological fracture of LEFT mandible - Hunter Hoffman offered:               -Definitive approach including jaw resection, placement of reconstruction plate followed later by reconstruction with vascularized vs nonvascularized bone graft; this is beyond my scope of practice and I would recommend patient be referred to [facility] for such workup and treatment; patient is considering this option discussed with him possible sequela of Hoffman treatment or failed treatment including worsening/spreading osteomyelitis and sepsis which he understands -Cont on prolonged course of IV abx per above   SIRS -secondary to osteomyelitis left jaw   Microcytic anemia -5/16 severe iron deficiency - 5/16 iron infusion 1000 mg x 1.  Iron p.o. 325 mg daily starting in Am -5/16 vitamin C 500 mg daily   Tobacco abuse     - cessation done   Self reported claim of renal cell CA -Pt claims remote hx of L sided renal lesion described as renal cancer for which pt has been treating with homeopathic remedies, such as pressure points -Ordered and reviewed renal US. Hoffman evidence of masses or hydronephrosis noted -would have pt f/u with PCP         Consultants: Oral surgeon, ID Procedures performed:   Disposition: Home Diet recommendation:  Full liquid diet DISCHARGE MEDICATION: Allergies as of 10/01/2021   Hoffman  Known Allergies      Medication List     STOP taking these medications    ibuprofen 200 MG tablet Commonly known as: ADVIL       TAKE these medications    acetaminophen 500 MG tablet Commonly known as: TYLENOL Take 1,000 mg by mouth every 6 (six) hours as needed for moderate pain.   ascorbic acid 500 MG tablet Commonly known as: VITAMIN C Take 1 tablet (500 mg total) by mouth  daily. Start taking on: Oct 02, 2021   cefTRIAXone  IVPB Commonly known as: ROCEPHIN Inject 2 g into the vein daily for 24 days. Indication:  Jaw Osteo First Dose: Yes Last Day of Therapy:  10/25/21 Labs - Once weekly:  CBC/D and BMP, Labs - Every other week:  ESR and CRP Method of administration: IV Push Method of administration may be changed at the discretion of home infusion pharmacist based upon assessment of the patient and/or caregiver's ability to self-administer the medication ordered.   ferrous sulfate 325 (65 FE) MG tablet Take 1 tablet (325 mg total) by mouth daily with breakfast. Start taking on: Oct 02, 2021   ketorolac 10 MG tablet Commonly known as: TORADOL Take 1 tablet (10 mg total) by mouth every 6 (six) hours as needed.   metroNIDAZOLE 500 MG tablet Commonly known as: Flagyl Take 1 tablet (500 mg total) by mouth 2 (two) times daily for 24 days. **End date 10/25/21   Hoffman ALCOHOL WHILE TAKING               Discharge Care Instructions  (From admission, onward)           Start     Ordered   10/01/21 0000  Change dressing on IV access line weekly and PRN  (Home infusion instructions - [facility] )        10/01/21 1155            Hunter Hoffman Follow up on 10/05/2021.   Why: Your hospital follow up appointment is scheduled for Tuesday Oct 05, 2021 at 8:45am. You will see Hunter Hoffman. Bring a copy of your ID, a $25.00 copay, and any medications you are taking. Contact information: [address] [place] [phone]        Hunter Hoffman Follow up.   Why: [facility] will provide IV antibiotics in the home for 6 weeks.        Care, Hunter Hoffman Follow up.   Specialty: Home Health Services Why: Hunter Hoffman will provide nursing for PICC line maintenance and labs. Contact information: [place] [address] [phone]                 Discharge Exam: Danley Danker Weights   09/27/21 0552 09/27/21 1532  Weight: 82 kg 82 kg   General exam: Awake, laying in bed, in nad Respiratory system: Normal respiratory effort, Hoffman wheezing Cardiovascular system: regular rate, s1, s2 Gastrointestinal system: Soft, nondistended, positive BS Central nervous system: CN2-12 grossly intact, strength intact Extremities: Perfused, Hoffman clubbing Skin: Normal skin turgor, Hoffman notable skin lesions seen Psychiatry: Mood normal // Hoffman visual hallucinations   Condition at discharge: fair  The results of significant diagnostics from this hospitalization (including imaging, microbiology, ancillary and laboratory) are listed below for reference.   Imaging Studies: US RENAL  Result Date: 09/29/2021 CLINICAL DATA:  Remote history of renal cell carcinoma. EXAM: RENAL /  URINARY TRACT ULTRASOUND COMPLETE COMPARISON:  None. FINDINGS: Right Kidney: Renal measurements: 11.5 x 3.9 x 5.6 cm = volume: 130 mL. Echogenicity within normal limits. Hoffman mass or hydronephrosis visualized. Left Kidney: Renal measurements: 9.0 x 5.3 x 5.8 cm = volume: 142 mL. Echogenicity within normal limits. Hoffman mass or hydronephrosis visualized. Bladder: Appears normal for degree of bladder distention. Other: Examination is technically limited secondary to overlying bowel gas. IMPRESSION: 1. Normal renal ultrasound. Note is made that small solid renal lesions can not be excluded by ultrasound. If there is high clinical concern, recommend further evaluation with MRI. Electronically Signed   By: Ronney Asters M.D.   On: 09/29/2021 17:23   CT Maxillofacial W Contrast  Result Date: 09/27/2021 CLINICAL DATA:  Maxillary/facial abscess EXAM: CT MAXILLOFACIAL WITH CONTRAST TECHNIQUE: Multidetector CT imaging of the maxillofacial structures was performed with intravenous contrast. Multiplanar CT image reconstructions were also generated. RADIATION DOSE REDUCTION: This exam was performed according  to the departmental dose-optimization program which includes automated exposure control, adjustment of the mA and/or kV according to patient size and/or use of iterative reconstruction technique. CONTRAST:  58m OMNIPAQUE IOHEXOL 300 MG/ML  SOLN COMPARISON:  None Available. FINDINGS: Osseous: Permeative destruction in the left mandible extending from the level of the canine to the coronoid process and mandibular neck. The regional soft tissues are swollen at the level of the masticator space and cheek with ill-defined low-density and peripheral enhancement along the superficial aspect of the diseased bone. Diffuse dental caries with periapical erosion without single offending tooth seen in the left mandible. Acute osteomyelitis or aggressive malignancy are both considered with this radiographic appearance. Case discussed with Dr. BSedonia Small somewhat protracted symptoms but there is also fever and white count. Inflammatory markers, blood cultures, or biopsy may clarify. 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 There is C4-5 non segmentation with subjacent predominant disc collapse, spurring, and endplate sclerosis. Asymmetric upper right facet spurring encroaching on the foramina. Orbits: Hoffman evidence of inflammation or mass. Sinuses: Negative Soft tissues: Negative Limited intracranial: Negative IMPRESSION: Permeative destruction of the left mandible with extensive swelling and ill-defined necrotic or purulent low-density along the outer surface of the mandible in the buccal and masticator spaces. In the setting of fever and leukocytosis, acute osteomyelitis is favored over aggressive malignancy. Electronically Signed   By: JJorje GuildM.D.   On: 09/27/2021 04:36   [place] SITE RITE  Result Date: 09/28/2021 If Site Rite image not attached, placement could not be confirmed due to current cardiac rhythm.  CT MAXILLOFACIAL WO CONTRAST  Result Date: 09/28/2021 CLINICAL DATA:  Mandible osteomyelitis EXAM: CT  MAXILLOFACIAL WITHOUT CONTRAST TECHNIQUE: Multidetector CT imaging of the maxillofacial structures was performed. Multiplanar CT image reconstructions were also generated. RADIATION DOSE REDUCTION: This exam was performed according to the departmental dose-optimization program which includes automated exposure control, adjustment of the mA and/or kV according to patient size and/or use of iterative reconstruction technique. COMPARISON:  Maxillofacial CT dated 1 day prior FINDINGS: Osseous: There has been interval extraction of multiple mandibular teeth. There is permeative destruction of the left hemi mandible extending from the body throughout the coronoid process to the neck of the mandibular condyle consistent with osteomyelitis. There is a nondisplaced pathologic fracture of the body of the mandible which violates the mandibular foramen (7-42). There is marked swelling in the overlying soft tissues of the left face with asymmetric thickening of the platysma and muscles of mastication. The previously seen peripheral enhancement is not appreciated in  the absence of intravenous contrast. The mandibular condyles are normally situated. Carious lesions are noted involving multiple of the remaining teeth. Orbits: The globes and orbits are unremarkable. Sinuses: The paranasal sinuses are clear. Soft tissues: Extensive soft tissue swelling of the left face as above. Limited intracranial: Imaged portions of the intracranial compartment are unremarkable. IMPRESSION: 1. Permeative destruction of the left hemimandible as above remains most consistent with osteomyelitis. Significant overlying soft tissue swelling and thickening of the adjacent musculature is similar to the prior study. 2. Nondisplaced fracture through the body of the mandible with violation of the mandibular foramen. 3. Interval extraction of multiple mandibular teeth. Electronically Signed   By: Valetta Mole M.D.   On: 09/28/2021 11:19     Microbiology: Results for orders placed or performed during the hospital encounter of 09/27/21  Culture, blood (Routine X 2) w Reflex to ID Panel     Status: None (Preliminary result)   Collection Time: 09/27/21  8:06 AM   Specimen: BLOOD  Result Value Ref Range Status   Specimen Description   Final    BLOOD LT [facility] Performed at [place] [address], [place], [place] [address]    Special Requests   Final    IN PEDIATRIC BOTTLE Blood Culture adequate volume Performed at [place] [address], [place], [place] [address]    Culture   Final    Hoffman GROWTH 4 DAYS Performed at [facility], [place] [address], [place], [place] [address]    Report Status PENDING  Incomplete  Surgical PCR screen     Status: None   Collection Time: 09/27/21  8:06 AM   Specimen: Nasal Swab  Result Value Ref Range Status   MRSA, PCR NEGATIVE NEGATIVE Final   Staphylococcus aureus NEGATIVE NEGATIVE Final    Comment: (NOTE) The Xpert SA Assay (FDA approved for NASAL specimens in patients 71 years of age and older), is one component of a comprehensive surveillance program. It is not intended to diagnose infection nor to guide or monitor treatment. Performed at [facility], [place] [address] [place], [place] [address]   Culture, blood (Routine X 2) w Reflex to ID Panel     Status: None (Preliminary result)   Collection Time: 09/27/21  8:18 AM   Specimen: BLOOD  Result Value Ref Range Status   Specimen Description   Final    BLOOD RT HAND Performed at [place] [address] [place], [place] [address]    Special Requests   Final    IN PEDIATRIC BOTTLE Blood Culture adequate volume Performed at [place] [address] [place], [place] [address]    Culture   Final    Hoffman GROWTH 4 DAYS Performed at [facility], [place] [address] [place], [place] [address]    Report Status  PENDING  Incomplete  Aerobic/Anaerobic Culture w Gram Stain (surgical/deep wound)     Status: None (Preliminary result)   Collection Time: 09/27/21  5:36 PM   Specimen: Bone; Tissue  Result Value Ref Range Status   Specimen Description   Final    BONE Performed at [place] [address] [place], [place] [address]    Special Requests   Final    NONE Performed at [facility], [place] [address] [place], [place] [address]    Gram Stain   Final    Hoffman WBC SEEN Hoffman ORGANISMS  SEEN Performed at [facility], [place] [address], [place], [place] [address]    Culture   Final    RARE NORMAL OROPHARYNGEAL FLORA Hoffman GROUP A STREP (S.PYOGENES) ISOLATED Hoffman STAPHYLOCOCCUS AUREUS ISOLATED Hoffman ANAEROBES ISOLATED; CULTURE IN PROGRESS FOR 5 DAYS    Report Status PENDING  Incomplete  Aerobic/Anaerobic Culture w Gram Stain (surgical/deep wound)     Status: None (Preliminary result)   Collection Time: 09/27/21  5:46 PM   Specimen: Wound; Abscess  Result Value Ref Range Status   Specimen Description   Final    WOUND Performed at [place] [address], [place], [place] [address]    Special Requests   Final    NONE Performed at [facility], [place] [address], [place], [place] [address]    Gram Stain   Final    Hoffman WBC SEEN Hoffman ORGANISMS SEEN Performed at [facility], [place] [address], [place],  [address]    Culture   Final    RARE NORMAL OROPHARYNGEAL FLORA Hoffman GROUP A STREP (S.PYOGENES) ISOLATED Hoffman STAPHYLOCOCCUS AUREUS ISOLATED Hoffman ANAEROBES ISOLATED; CULTURE IN PROGRESS FOR 5 DAYS    Report Status PENDING  Incomplete    Labs: CBC: Recent Labs  Lab 09/28/21 0451 09/28/21 1312 09/29/21 0446 09/30/21 0440 10/01/21 0337  WBC 8.9 13.9* 6.4 5.4 6.5  NEUTROABS  --  10.4* 3.4 3.0 3.5  HGB 10.4* 11.2* 9.9* 10.4* 10.6*  HCT 34.4* 36.1* 31.8* 33.9* 33.5*  MCV 65.8* 64.5* 65.0* 64.9* 64.4*  PLT  PLATELET CLUMPS NOTED ON SMEAR, UNABLE TO ESTIMATE 203 169 165 PLATELET CLUMPS NOTED ON SMEAR, COUNT APPEARS ADEQUATE   Basic Metabolic Panel: Recent Labs  Lab 09/27/21 0324 09/28/21 0451 09/28/21 1312 09/29/21 0446 09/30/21 0440 10/01/21 0337  NA 139 138  --  139 142 140  K 3.9 4.6  --  4.0 4.3 4.6  CL 106 104  --  104 107 106  CO2 27 25  --  29 29 28   GLUCOSE 112* 117*  --  95 104* 101*  BUN 12 15  --  16 16 16   CREATININE 0.79 0.82  --  0.83 0.89 1.01  CALCIUM 9.0 8.5*  --  8.5* 9.1 9.2  MG  --   --  2.1 2.3 2.3 2.4  PHOS  --   --  2.6 2.8 3.3 3.4   Liver Function Tests: Recent Labs  Lab 09/28/21 0451 09/29/21 0446 09/30/21 0440 10/01/21 0337  AST 18 17 15 18   ALT 12 15 14 14   ALKPHOS 78 71 76 83  BILITOT 0.7 0.5 0.6 0.8  PROT 7.1 6.9 7.3 7.3  ALBUMIN 3.3* 3.1* 3.4* 3.4*   CBG: Hoffman results for input(s): GLUCAP in the last 168 hours.  Discharge time spent: less than 30 minutes.  Signed: Marylu Lund, MD [facility] 10/01/2021

## 2021-10-02 LAB — AEROBIC/ANAEROBIC CULTURE W GRAM STAIN (SURGICAL/DEEP WOUND)
Culture: NORMAL
Culture: NORMAL
Gram Stain: NONE SEEN
Gram Stain: NONE SEEN

## 2021-10-02 LAB — CULTURE, BLOOD (ROUTINE X 2)
Culture: NO GROWTH
Culture: NO GROWTH
Special Requests: ADEQUATE
Special Requests: ADEQUATE

## 2021-10-05 ENCOUNTER — Ambulatory Visit (INDEPENDENT_AMBULATORY_CARE_PROVIDER_SITE_OTHER): Payer: Self-pay | Admitting: Internal Medicine

## 2021-10-05 ENCOUNTER — Encounter: Payer: Self-pay | Admitting: Internal Medicine

## 2021-10-05 VITALS — BP 134/87 | HR 75 | Temp 98.2°F | Ht 68.0 in | Wt 161.6 lb

## 2021-10-05 DIAGNOSIS — Z9189 Other specified personal risk factors, not elsewhere classified: Secondary | ICD-10-CM | POA: Insufficient documentation

## 2021-10-05 DIAGNOSIS — M797 Fibromyalgia: Secondary | ICD-10-CM

## 2021-10-05 DIAGNOSIS — J309 Allergic rhinitis, unspecified: Secondary | ICD-10-CM | POA: Insufficient documentation

## 2021-10-05 DIAGNOSIS — F4310 Post-traumatic stress disorder, unspecified: Secondary | ICD-10-CM | POA: Insufficient documentation

## 2021-10-05 DIAGNOSIS — D509 Iron deficiency anemia, unspecified: Secondary | ICD-10-CM

## 2021-10-05 DIAGNOSIS — M272 Inflammatory conditions of jaws: Secondary | ICD-10-CM

## 2021-10-05 DIAGNOSIS — G9332 Myalgic encephalomyelitis/chronic fatigue syndrome: Secondary | ICD-10-CM | POA: Insufficient documentation

## 2021-10-05 NOTE — Patient Instructions (Signed)
Thank you, [name] for allowing [place] to provide your care today. Today we discussed:  Jaw Osteonecrosis: -continue antibiotics through PICC line -continue Toradol tablets for pain as needed -needs follow up with oral surgery: [phone]  [place] [address], [place]  OR needs to see local dentist, we can refer to a dentist if needed though patient with need to self pay until coverage obtained.   I have ordered the following labs for you:   Lab Orders         CBC with Diff         CMP14 + Anion Gap         Sed Rate (ESR)         CRP (C-Reactive Protein)      I will call if any are abnormal. All of your labs can be accessed through "My Chart".  My Chart Access: [email or web address]  Please follow-up in 3 months or make an earlier appointment if needed.  Please make sure to arrive 15 minutes prior to your next appointment. If you arrive late, you may be asked to reschedule.    We look forward to seeing you next time. Please call our clinic at [phone] if you have any questions or concerns. The best time to call is Monday-Friday from 9am-4pm, but there is someone available 24/7. If after hours or the weekend, call the main hospital number and ask for the Internal Medicine Resident On-Call. If you need medication refills, please notify your pharmacy one week in advance and they will send [place] a request.   Thank you for letting us take part in your care. Wishing you the best!  Wayland Denis, MD 10/05/2021, 10:15 AM IM Resident, PGY-1

## 2021-10-05 NOTE — Progress Notes (Addendum)
CC: hospital follow up for jaw osteonecrosis, will likely not follow with [facility] as PCP  HPI:  [name] is a 56 y.o. male with a past medical history stated below.   Acute osteomyelitis of jaw Patient presents for hospital follow up appointment and to establish care with [facility]. He was hospitalized from 5/16 - 5/19 for jaw osteonecrosis.  He presented to the ED 5/15 jaw pain and swelling.  Notable for jaw osteonecrosis, fracture of the mandible, SIRS.  Patient started on broad-spectrum antibiotics and underwent incision and drainage with debridement and tooth removal with Dr. Conley Simmonds, oral surgery.  Patient also noted to have microcytic anemia with iron deficiency and received iron infusions.  At discharge PICC was placed and patient instructed to self inject Rocephin and Flagyl through PICC until 6/12 after which she will take Augmentin p.o. for 2 additional weeks.  He was instructed to stick to full liquid diet.  Also discharged on iron supplement.  At discharge screening process for obtaining Medicaid coverage was begun by social work. Patient was supposed to have blood work drawn today by [facility] nurse, ID indicated they would like weekly CBC, CMP, ESR and CRP. Patient refused draw today by [facility] RN, was in a rush to get to clinic. Requesting we do them here. We can obtain these today.   Today patient presents as self-pay.  He reports improvements in his swelling and pain since discharge.  He endorses adherence to antibiotic regimen though reports does not like using medications.  He prefers naturopathic medicine, "natural healing", meditation and tai chi in place of medicine.  We discussed these are complementary options and medication plus these complementary options are likely the best treatment for him.  He is in agreement with this and plans to continue antibiotics and Augmentin.  He denies side effects from antibiotics, no fevers, worsening swelling or pain.  Patient was told that he would need to  follow-up with [facility] or [facility] surgery to reevaluate need for additional procedures that were outside of the scope of Dr. Cheri Fowler expertise.  Patient reports after his 6 weeks of antibiotics he will reconsider whether or not he feels he needs additional surgical services.  Patient was encouraged to fill out orange card and CAFA packets which were provided to patient today to obtain coverage.  He was encouraged to follow-up with Dr. Conley Simmonds (office [facility]) vs local oral surgeon/dentist give recent surgical procedure if he is not able to see [facility] or [facility] surgery in a timely manner. He has had 19lb weight loss since ED admission though reports he has begun eating soft foods and has regained some weight already.  On exam, left jaw healing well, patient able to open jaw about 3cm maximum. No erythema, abscesses, lesions, or drainage. Patient still has some asymmetry of jaw swelling. Patient feels small suture at the top left gum line though discussed not to taper with this and to let sutures fall out naturally.   On assessment, patient with left sided jaw osteonecrosis requiring debridment remains adherent to antibiotic dosing. Jaw healing well.  Plan: -Patient to continue rocephin and flagyl via PICC until 6/12 at which time he will switch to Augmentin for 2 additional weeks.  -Continue Toradol PRN for pain, discussed side effects of NSAIDS and encouraged patient to skip days of use -Check CBC, CMP, ESR, CRP -Follow up with [facility] (Dr. Juleen China) June 9th -Needs follow up with oral surgery: provided with Dr. Cheri Fowler office information or can put in referral to  dentistry whichever patient would prefer, ultimately needs to see [facility] or [facility] provider who would re-evaluate need for additional surgical corrections -Continue vit C supp for wound healing  Patient is not a good candidate for [facility] resident clinic. He reported he googled this provider before arriving and knew personal information about provider. At the  conclusion of the visit gave this provider papers with nonsensical sentences, explicit words, and further evidence of researching family history of this provider. These papers were given to on staff attending and details of encounter were escalated to program director.   Microcytic anemia Patient noted to have hemoglobin 11.6 during hospitalization and iron profile consistent with iron deficiency anemia. Patient denies hematochezia/melena. Has never had a colonoscopy. Discussed colon cancer screening and patient declines screening. Reports he has history of renal cell carcinoma which he healed with naturopathic medicine. Patient currently denies, dizziness, fatigue, SOB. Is taking his iron supplement daily, no constipation. May also have history of thalassemia though patient is not sure if confirmatory testing was completed to verify diagnosis.  Plan: -Continue iron supplementation -Check CBC    Past Medical History:  Diagnosis Date   Palpitations 04/22/2008   Qualifier: Diagnosis of  By: Wynona Luna    SIRS (systemic inflammatory response syndrome) ([place]) 09/27/2021    Current Outpatient Medications on File Prior to Visit  Medication Sig Dispense Refill   acetaminophen (TYLENOL) 500 MG tablet Take 1,000 mg by mouth every 6 (six) hours as needed for moderate pain.     [START ON 10/26/2021] amoxicillin-clavulanate (AUGMENTIN) 875-125 MG tablet Take 1 tablet by mouth 2  times daily for 14 days. 28 tablet 0   ascorbic acid (VITAMIN C) 500 MG tablet Take 1 tablet (500 mg total) by mouth daily. 30 tablet 0   cefTRIAXone (ROCEPHIN) IVPB Inject 2 g into the vein daily for 24 days. Indication:  Jaw Osteo First Dose: Yes Last Day of Therapy:  10/25/21 Labs - Once weekly:  CBC/D and BMP, Labs - Every other week:  ESR and CRP Method of administration: IV Push Method of administration may be changed at the discretion of home infusion pharmacist based upon assessment of the patient and/or caregiver's  ability to self-administer the medication ordered. 25 Units 0   ferrous sulfate 325 (65 FE) MG tablet Take 1 tablet (325 mg total) by mouth daily with breakfast. 30 tablet 0   ketorolac (TORADOL) 10 MG tablet Take 1 tablet by mouth every 6 hours as needed. 20 tablet 0   metroNIDAZOLE (FLAGYL) 500 MG tablet Take 1 tablet (500 mg total) by mouth 2 (two) times daily for 24 days. **End date 10/25/21   **NO ALCOHOL WHILE TAKING** 49 tablet 0   No current facility-administered medications on file prior to visit.    Family History  Problem Relation Age of Onset   Multiple myeloma Mother     Social History: Lives in [place]. 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   Review of Systems: ROS negative except for what is noted on the assessment and plan.  Vitals:   10/05/21 0922  BP: 134/87  Pulse: 75  Temp: 98.2 F (36.8 C)  TempSrc: Oral  SpO2: 100%  Weight: 161 lb 9.6 oz (73.3 kg)  Height: 5' 8"  (1.727 m)     Physical Exam: General: Well appearing normal weight male, NAD HENT: normocephalic, atraumatic, external ears and nares  appear unremarkable, left sided jaw swelling appreciated, gums pink no erythema, no masses or lesions. Suture visible top left gum line.  EYES: conjunctiva non-erythematous, no scleral icterus CV: regular rate, normal rhythm, no murmurs, rubs, gallops. Pulmonary: normal work of breathing on RA, lungs clear to auscultation, no rales, wheezes, rhonchi Skin: Warm and dry, no rashes or lesions on exposed surfaces Neurological: MS: awake, alert and oriented x3, normal speech and fund of knowledge Motor: moves all extremities antigravity Psych: normal affect, linear thought process, not responding to external stimuli.      See Encounters Tab for problem based charting.  Patient discussed with Dr. Georgana Curio, M.D. [facility]  Internal Medicine, PGY-1 Pager: [phone] Date 10/05/2021 Time 2:15 PM

## 2021-10-05 NOTE — Assessment & Plan Note (Addendum)
Patient noted to have hemoglobin 11.6 during hospitalization and iron profile consistent with iron deficiency anemia. Patient denies hematochezia/melena. Has never had a colonoscopy. Discussed colon cancer screening and patient declines screening. Reports he has history of renal cell carcinoma which he healed with naturopathic medicine. Patient currently denies, dizziness, fatigue, SOB. Is taking his iron supplement daily, no constipation. May also have history of thalassemia though patient is not sure if confirmatory testing was completed to verify diagnosis.  Plan: -Continue iron supplementation -Check CBC

## 2021-10-05 NOTE — Assessment & Plan Note (Addendum)
Patient presents for hospital follow up appointment and to establish care with [facility]. He was hospitalized from 5/16 - 5/19 for jaw osteonecrosis.  He presented to the ED 5/15 jaw pain and swelling.  Notable for jaw osteonecrosis, fracture of the mandible, SIRS.  Patient started on broad-spectrum antibiotics and underwent incision and drainage with debridement and tooth removal with Dr. Conley Simmonds, oral surgery.  Patient also noted to have microcytic anemia with iron deficiency and received iron infusions.  At discharge PICC was placed and patient instructed to self inject Rocephin and Flagyl through PICC until 6/12 after which she will take Augmentin p.o. for 2 additional weeks.  He was instructed to stick to full liquid diet.  Also discharged on iron supplement.  At discharge screening process for obtaining Medicaid coverage was begun by social work. Patient was supposed to have blood work drawn today by [facility] nurse, ID indicated they would like weekly CBC, CMP, ESR and CRP. Patient refused draw today by [facility] RN, was in a rush to get to clinic. Requesting we do them here. We can obtain these today.   Today patient presents as self-pay.  He reports improvements in his swelling and pain since discharge.  He endorses adherence to antibiotic regimen though reports does not like using medications.  He prefers naturopathic medicine, "natural healing", meditation and tai chi in place of medicine.  We discussed these are complementary options and medication plus these complementary options are likely the best treatment for him.  He is in agreement with this and plans to continue antibiotics and Augmentin.  He denies side effects from antibiotics, no fevers, worsening swelling or pain.  Patient was told that he would need to follow-up with [facility] or [facility] surgery to reevaluate need for additional procedures that were outside of the scope of Dr. Cheri Fowler expertise.  Patient reports after his 6 weeks of antibiotics he will reconsider  whether or not he feels he needs additional surgical services.  Patient was encouraged to fill out orange card and CAFA packets which were provided to patient today to obtain coverage.  He was encouraged to follow-up with Dr. Conley Simmonds (office [facility]) vs local oral surgeon/dentist give recent surgical procedure if he is not able to see [facility] or [facility] surgery in a timely manner. He has had 19lb weight loss since ED admission though reports he has begun eating soft foods and has regained some weight already.  On exam, left jaw healing well, patient able to open jaw about 3cm maximum. No erythema, abscesses, lesions, or drainage. Patient still has some asymmetry of jaw swelling. Patient feels small suture at the top left gum line though discussed not to taper with this and to let sutures fall out naturally.   On assessment, patient with left sided jaw osteonecrosis requiring debridment remains adherent to antibiotic dosing. Jaw healing well.  Plan: -Patient to continue rocephin and flagyl via PICC until 6/12 at which time he will switch to Augmentin for 2 additional weeks.  -Continue Toradol PRN for pain, discussed side effects of NSAIDS and encouraged patient to skip days of use -Check CBC, CMP, ESR, CRP -Follow up with [facility] (Dr. Juleen China) June 9th -Needs follow up with oral surgery: provided with Dr. Cheri Fowler office information or can put in referral to dentistry whichever patient would prefer, ultimately needs to see [facility] or [facility] provider who would re-evaluate need for additional surgical corrections -Continue vit C supp for wound healing  Patient is not a good candidate for [facility] resident  clinic. He reported he googled this provider before arriving and knew personal information about provider. At the conclusion of the visit gave this provider papers with nonsensical sentences, explicit words, and further evidence of researching family history of this provider. These papers were given to on staff attending  and details of encounter were escalated to program director.

## 2021-10-06 LAB — C-REACTIVE PROTEIN: CRP: 11 mg/L — ABNORMAL HIGH (ref 0–10)

## 2021-10-06 LAB — CBC WITH DIFFERENTIAL/PLATELET
Basophils Absolute: 0.1 10*3/uL (ref 0.0–0.2)
Basos: 1 %
EOS (ABSOLUTE): 0.1 10*3/uL (ref 0.0–0.4)
Eos: 1 %
Hematocrit: 36.4 % — ABNORMAL LOW (ref 37.5–51.0)
Hemoglobin: 11.1 g/dL — ABNORMAL LOW (ref 13.0–17.7)
Immature Grans (Abs): 0.1 10*3/uL (ref 0.0–0.1)
Immature Granulocytes: 1 %
Lymphocytes Absolute: 1.7 10*3/uL (ref 0.7–3.1)
Lymphs: 23 %
MCH: 20 pg — ABNORMAL LOW (ref 26.6–33.0)
MCHC: 30.5 g/dL — ABNORMAL LOW (ref 31.5–35.7)
MCV: 66 fL — ABNORMAL LOW (ref 79–97)
Monocytes Absolute: 0.5 10*3/uL (ref 0.1–0.9)
Monocytes: 6 %
Neutrophils Absolute: 5.3 10*3/uL (ref 1.4–7.0)
Neutrophils: 68 %
Platelets: 153 10*3/uL (ref 150–450)
RBC: 5.55 x10E6/uL (ref 4.14–5.80)
RDW: 15.8 % — ABNORMAL HIGH (ref 11.6–15.4)
WBC: 7.7 10*3/uL (ref 3.4–10.8)

## 2021-10-06 LAB — CMP14 + ANION GAP
ALT: 59 IU/L — ABNORMAL HIGH (ref 0–44)
AST: 57 IU/L — ABNORMAL HIGH (ref 0–40)
Albumin/Globulin Ratio: 1.4 (ref 1.2–2.2)
Albumin: 4.2 g/dL (ref 3.8–4.9)
Alkaline Phosphatase: 107 IU/L (ref 44–121)
Anion Gap: 14 mmol/L (ref 10.0–18.0)
BUN/Creatinine Ratio: 14 (ref 9–20)
BUN: 12 mg/dL (ref 6–24)
Bilirubin Total: 0.3 mg/dL (ref 0.0–1.2)
CO2: 24 mmol/L (ref 20–29)
Calcium: 9.2 mg/dL (ref 8.7–10.2)
Chloride: 103 mmol/L (ref 96–106)
Creatinine, Ser: 0.85 mg/dL (ref 0.76–1.27)
Globulin, Total: 3 g/dL (ref 1.5–4.5)
Glucose: 100 mg/dL — ABNORMAL HIGH (ref 70–99)
Potassium: 4.5 mmol/L (ref 3.5–5.2)
Sodium: 141 mmol/L (ref 134–144)
Total Protein: 7.2 g/dL (ref 6.0–8.5)
eGFR: 103 mL/min/{1.73_m2} (ref >59)

## 2021-10-06 LAB — SEDIMENTATION RATE: Sed Rate: 8 mm/hr (ref 0–30)

## 2021-10-07 NOTE — Progress Notes (Signed)
Internal Medicine Clinic Attending  Case discussed with [name]  At the time of the visit.  We reviewed the resident's history and exam and pertinent patient test results.  I agree with the assessment, diagnosis, and plan of care documented in the resident's note.   Patient exhibited some inappropriate behavior during his visit. Encounter was initially going well, but then he admitted to [name] that he had researched her and her husband and knew details regarding her husband's family. He asked questions regarding where [name] would be going next year as she is a Publishing rights manager graduating next month. At the end of the visit he handed [name] paper's with nonsensical sentences and words including "penis" and "pedophile" and Dr. Ulyess Blossom name and asked her to give it to her husband. This behavior is highly unusual and we will monitor for any further attempted contact with [name]. If he does return to [facility], he should be staffed with a male resident if possible.

## 2021-10-13 ENCOUNTER — Telehealth: Payer: Self-pay

## 2021-10-13 NOTE — Telephone Encounter (Signed)
Received notification from Jeri Modena, RN with [facility] that home health has made attempts to visit patient, but that his living environment is not ideal for IV antibiotics.  Message from Jeri Modena, RN: "Hi [name]. I hope you enjoyed that extra day away yesterday.  We were alerted by the RN trying to see this patient that he lives in a dwelling in a friends backyard that is not conducive to home health or safe for RN.  Pt is receiving Ceftriaxone QD through 6/12. RN describes as a Engineer, production with a cloth draped over the front.  RN unable to locate pt and the [facility] agency will not able to serve patient. He is uninsured so I will connect with the patient to ensure he has running water and refrigeration and if he can use the friends home when administering the med. If there is a safe place for IVABX, I will reach out to [place] for pt to come to the [facility] care and labs. I will update you once  I know more.  Thanks all.   Pam "   RN called and spoke with patient. He states he is confused because no one has come to his house. Reports he has lived in the same house for 15 years with a stove and refrigeration.   Relayed this to [facility], she will try to coordinate a visit with home health for the patient to further assess his living situation and safety of IV antibiotics.   Per Dr. Earlene Plater: "Thanks for the update.  I think this situation is likely not conducive to maintaining a PICC line and IV antibiotics.  If we are not safely able to continue ceftriaxone via PICC line, I would advocate PICC line removal and placing him on Augmentin monotherapy.   [name]"   Will wait on update from Jeri Modena, RN.   Sandie Ano, RN

## 2021-10-18 NOTE — Telephone Encounter (Signed)
Received the following message from Jeri Modena, RN:   "Jamesetta So, RN [place] the RN was able to connect with [name] at the cell we provided on Friday and worked out to see pt today. Will keep you posted if any further issues.   Pam "

## 2021-10-20 NOTE — Telephone Encounter (Signed)
Community message sent to [facility] requesting update

## 2021-10-21 NOTE — Telephone Encounter (Signed)
I followed up with Pam and she stated that Alvis Lemmings was suppose to follow up with the patient Monday and was to call if there was any issues. Per Pam the nurses have not called complaining of issues.

## 2021-10-22 ENCOUNTER — Other Ambulatory Visit: Payer: Self-pay

## 2021-10-22 ENCOUNTER — Encounter: Payer: Self-pay | Admitting: Internal Medicine

## 2021-10-22 ENCOUNTER — Telehealth: Payer: Self-pay

## 2021-10-22 ENCOUNTER — Ambulatory Visit (INDEPENDENT_AMBULATORY_CARE_PROVIDER_SITE_OTHER): Payer: Self-pay | Admitting: Internal Medicine

## 2021-10-22 VITALS — BP 113/75 | HR 88 | Temp 98.2°F | Wt 160.0 lb

## 2021-10-22 DIAGNOSIS — Z452 Encounter for adjustment and management of vascular access device: Secondary | ICD-10-CM

## 2021-10-22 DIAGNOSIS — M8448XA Pathological fracture, other site, initial encounter for fracture: Secondary | ICD-10-CM

## 2021-10-22 DIAGNOSIS — M272 Inflammatory conditions of jaws: Secondary | ICD-10-CM

## 2021-10-22 NOTE — Progress Notes (Signed)
[place] [facility] for Infectious Disease  CHIEF COMPLAINT:    Follow up for mandibular OM  SUBJECTIVE:    Hunter Hoffman is a 56 y.o. male with PMHx as below who presents to the clinic for hospital follow-up of mandibular osteomyelitis.   Patient was hospitalized at [facility] from 5/15 through 10/01/2021 after presenting with left facial swelling.  He was found to have acute osteomyelitis of the jaw on the left as well as a pathological fracture of the left mandible.  He was taken to the OR with Dr. Conley Simmonds for I&D of the masticator and buccal space as well as debridement of the mandible and removal of teeth.  His surgical pathology was consistent with acute osteomyelitis and operative cultures only had normal flora.  Dr. Conley Simmonds recommend referral to [facility] or [facility] oral surgery for follow-up given his pathologic fracture.    Prior to discharge, he had a PICC line placed and was discharged on ceftriaxone 2 g IV every 24 hours as well as metronidazole 500 mg by mouth every 12 hours.  The end date for these antibiotics is 4 weeks from his OR on 10/25/2021.  Labs on 5/23 with ESR was 8 and CRP 11.  OP AT labs from 6/5 indicate that his ESR is still normal at 13 and his CRP is normalized at 1.  He reports no issues with the antibiotics and generally tolerating well.  His PICC line has been working without issues.  He has Augmentin on hand to start taking following the IV course of treatment.  He has not followed up with oral surgery yet and feels that the stitches in his mouth are impairing his natural ability to heal.  He had follow-up with the internal medicine clinic on 5/23, however, he will not be following up with them after his encounter with the resident in clinic that day and his inappropriate behavior.  Please see A&P for the details of today's visit and status of the patient's medical problems.       Past Medical History:  Diagnosis Date   Palpitations 04/22/2008    Qualifier: Diagnosis of  By: Wynona Luna    SIRS (systemic inflammatory response syndrome) ([place]) 09/27/2021    Social History   Tobacco Use   Smoking status: Never   Smokeless tobacco: Never  Substance Use Topics   Alcohol use: Yes    Comment: occasionally 1/wk   Drug use: Not Currently    Family History  Problem Relation Age of Onset   Multiple myeloma Mother     No Known Allergies  Review of Systems  Constitutional:  Negative for chills and fever.  Gastrointestinal: Negative.   Skin:  Negative for rash.     OBJECTIVE:    Vitals:   10/22/21 1516  BP: 113/75  Pulse: 88  Temp: 98.2 F (36.8 C)  TempSrc: Oral  SpO2: 96%  Weight: 160 lb (72.6 kg)   Body mass index is 24.33 kg/m.  Physical Exam Constitutional:      General: He is not in acute distress.    Appearance: Normal appearance.  HENT:     Head: Normocephalic and atraumatic.     Mouth/Throat:     Comments: Swelling of left sided jaw has improved.  Able to open mouth about 3-4cm Eyes:     Extraocular Movements: Extraocular movements intact.     Conjunctiva/sclera: Conjunctivae normal.  Pulmonary:     Effort: Pulmonary effort is normal.  No respiratory distress.  Abdominal:     General: There is no distension.     Palpations: Abdomen is soft.  Musculoskeletal:     Cervical back: Normal range of motion and neck supple.     Comments: PICC in place.   Skin:    General: Skin is warm and dry.  Neurological:     General: No focal deficit present.     Mental Status: He is alert and oriented to person, place, and time.  Psychiatric:        Mood and Affect: Mood normal.        Behavior: Behavior normal.      Labs and Microbiology:    Latest Ref Rng & Units 10/05/2021   10:30 AM 10/01/2021    3:37 AM 09/30/2021    4:40 AM  CBC  WBC 3.4 - 10.8 x10E3/uL 7.7  6.5  5.4   Hemoglobin 13.0 - 17.7 g/dL 11.1  10.6  10.4   Hematocrit 37.5 - 51.0 % 36.4  33.5  33.9   Platelets 150 - 450 x10E3/uL 153   PLATELET CLUMPS NOTED ON SMEAR, COUNT APPEARS ADEQUATE  165       Latest Ref Rng & Units 10/05/2021   10:30 AM 10/01/2021    3:37 AM 09/30/2021    4:40 AM  CMP  Glucose 70 - 99 mg/dL 100  101  104   BUN 6 - 24 mg/dL 12  16  16    Creatinine 0.76 - 1.27 mg/dL 0.85  1.01  0.89   Sodium 134 - 144 mmol/L 141  140  142   Potassium 3.5 - 5.2 mmol/L 4.5  4.6  4.3   Chloride 96 - 106 mmol/L 103  106  107   CO2 20 - 29 mmol/L 24  28  29    Calcium 8.7 - 10.2 mg/dL 9.2  9.2  9.1   Total Protein 6.0 - 8.5 g/dL 7.2  7.3  7.3   Total Bilirubin 0.0 - 1.2 mg/dL 0.3  0.8  0.6   Alkaline Phos 44 - 121 IU/L 107  83  76   AST 0 - 40 IU/L 57  18  15   ALT 0 - 44 IU/L 59  14  14       ASSESSMENT & PLAN:    Acute osteomyelitis of jaw Patient is here for hospital follow-up of acute osteomyelitis of the left mandible with pathologic fracture.  He will complete 4 weeks of ceftriaxone IV and metronidazole p.o. on 10/25/2021.  At that time, will have PICC line removed and transition to Augmentin 875 twice daily p.o.  We will plan to continue this for another 2 weeks to complete 6 total weeks of antibiotics.  End date 11/08/21.  He also needs to have follow-up with dentistry and Dr. Conley Simmonds who can coordinate referral to [facility] or [facility] oral surgery for further management.  He states he would prefer to not come back here if needed so he will let us know of any issues following antibiotic therapy.  Peripherally inserted central catheter (PICC) in place No issues with PICC line.  Will be removed following antibiotic course.     Raynelle Highland for Infectious Disease [facility] 10/22/2021, 3:56 PM

## 2021-10-22 NOTE — Assessment & Plan Note (Signed)
No issues with PICC line.  Will be removed following antibiotic course.

## 2021-10-22 NOTE — Telephone Encounter (Signed)
Patient follow up with our office today and doing well with the picc line. No concerns. Patient also came by office today to let our office know patient is doing well.

## 2021-10-22 NOTE — Patient Instructions (Addendum)
Continue ceftriaxone and metronidazole (Flagyl) until 10/25/21  PICC line will be removed at that time  The folllowing day, please take Augmentin twice per day for 2 weeks  Call with any new concerns  Follow up with oral surgery:  [phone]  Marye Round [place], [place] [address], [place]

## 2021-10-22 NOTE — Telephone Encounter (Signed)
Per Dr. Earlene Plater reach out to [facility] home health with orders to pull picc on 6/12  Orders sent through secure message.  Juanita Laster, RMA

## 2021-10-22 NOTE — Assessment & Plan Note (Signed)
Patient is here for hospital follow-up of acute osteomyelitis of the left mandible with pathologic fracture.  He will complete 4 weeks of ceftriaxone IV and metronidazole p.o. on 10/25/2021.  At that time, will have PICC line removed and transition to Augmentin 875 twice daily p.o.  We will plan to continue this for another 2 weeks to complete 6 total weeks of antibiotics.  End date 11/08/21.  He also needs to have follow-up with dentistry and Dr. Ross Marcus who can coordinate referral to [facility] or [facility] oral surgery for further management.  He states he would prefer to not come back here if needed so he will let us know of any issues following antibiotic therapy.

## 2022-05-20 ENCOUNTER — Other Ambulatory Visit (HOSPITAL_COMMUNITY): Payer: Self-pay

## 2023-05-17 IMAGING — CT CT MAXILLOFACIAL W/ CM
3 series · 15 of 47 positions shown, 18 images · IV contrast (agent unspecified)
Comparison: None Available.

CLINICAL DATA: Maxillary/facial abscess

EXAM:
CT MAXILLOFACIAL WITH CONTRAST
TECHNIQUE: Multidetector CT imaging of the maxillofacial structures was
performed with intravenous contrast. Multiplanar CT image
reconstructions were also generated.

[Series 4: max soft · axial · 0.41mm/px · z∈[-235,-79]mm · 9 of 92 slices shown, 12 images]
[im 7/92  brain]
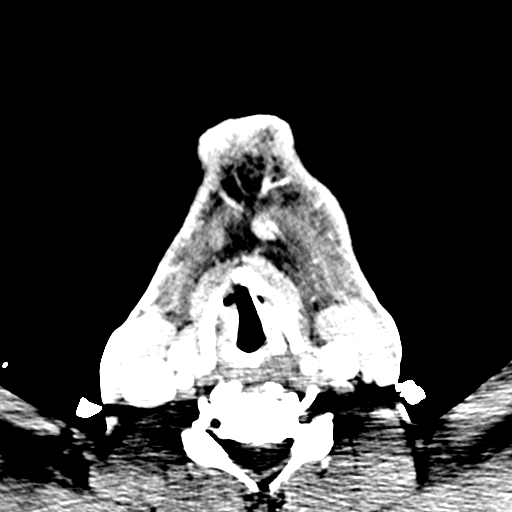
[im 7/92  bone]
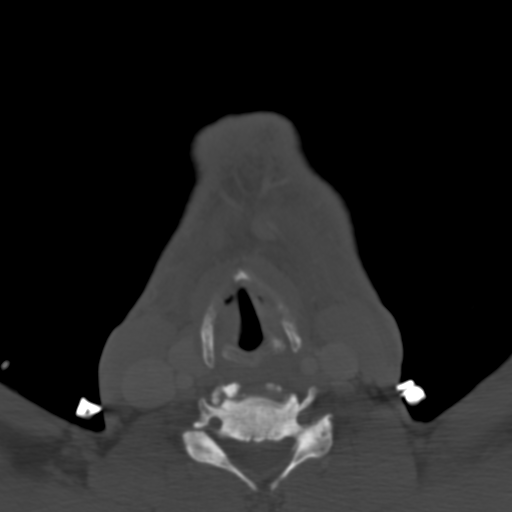
[im 16/92  bone]
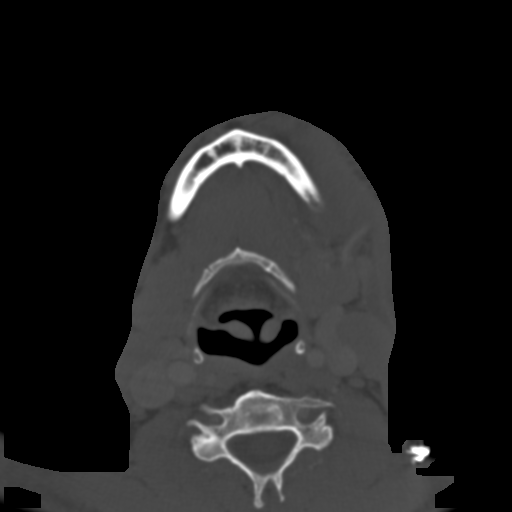
[im 26/92  bone]
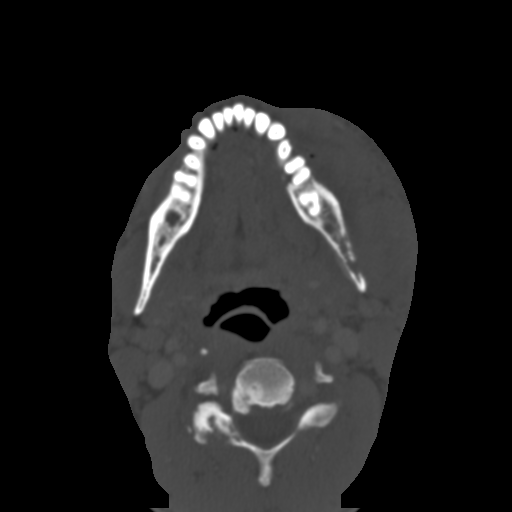
[im 35/92  bone]
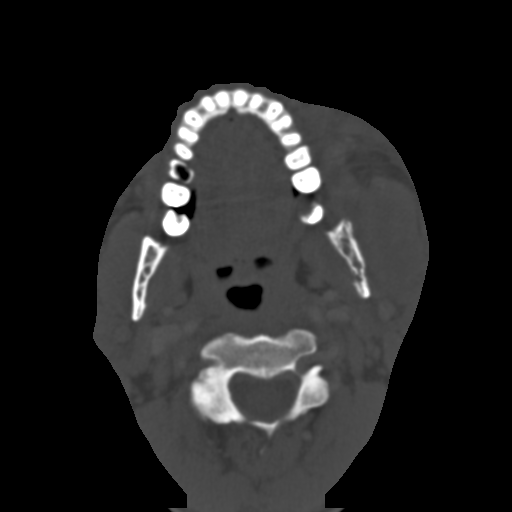
[im 48/92  brain]
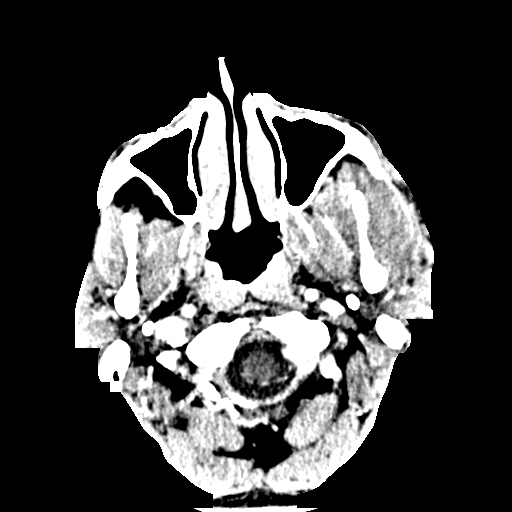
[im 48/92  bone]
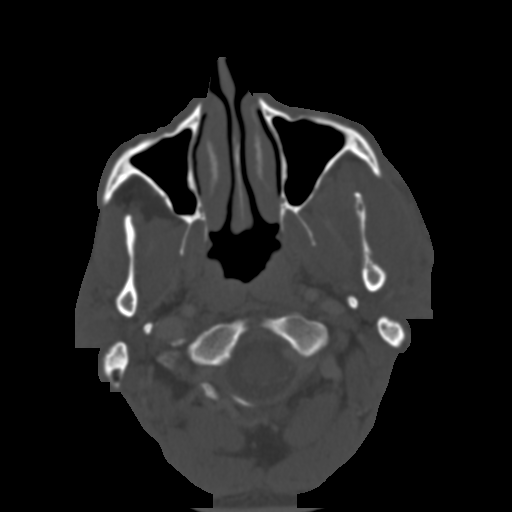
[im 57/92  bone]
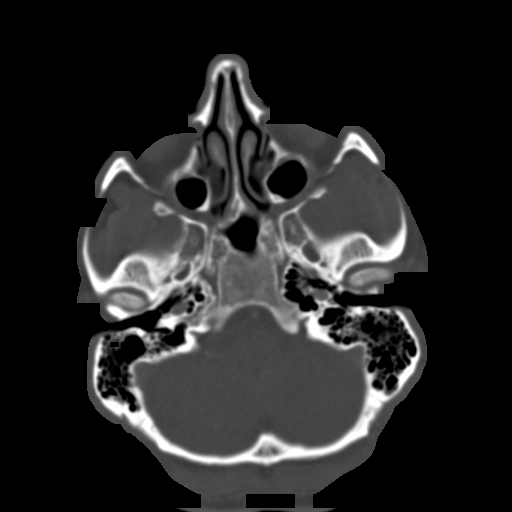
[im 66/92  bone]
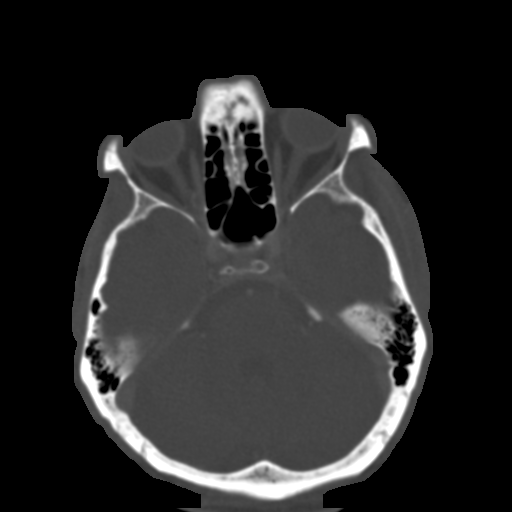
[im 76/92  bone]
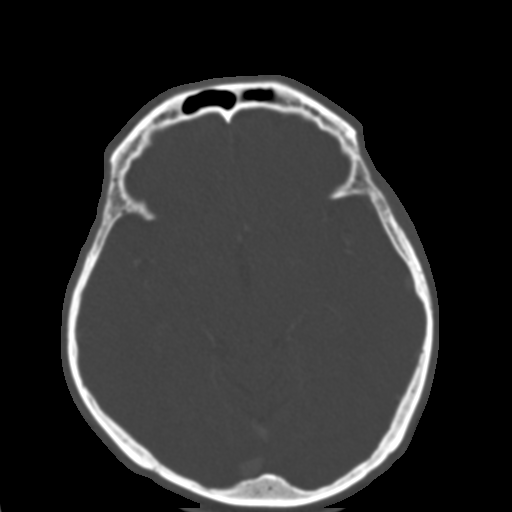
[im 85/92  brain]
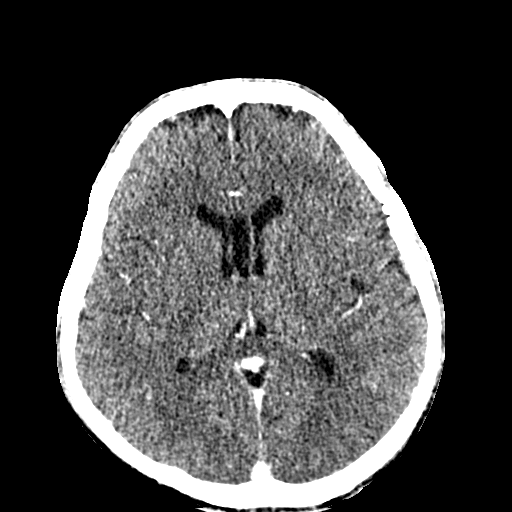
[im 85/92  bone]
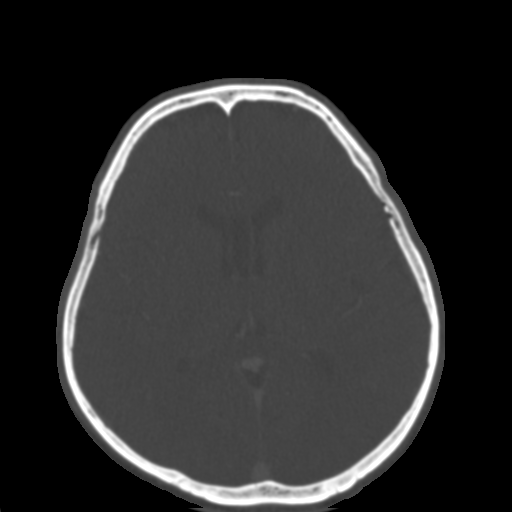

[Series 8: coronal soft · coronal · 0.37mm/px · 3 of 106 slices shown]
[im 36/106  bone]
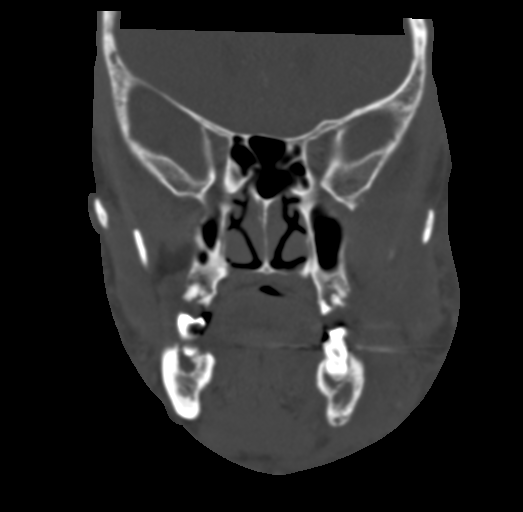
[im 47/106  bone]
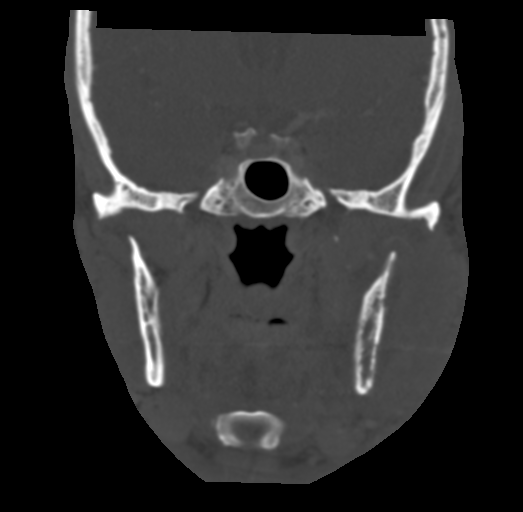
[im 59/106  bone]
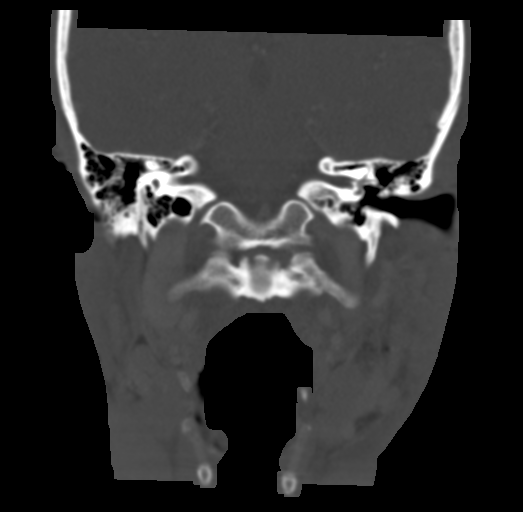

[Series 10: sagittal soft · sagittal · 0.37mm/px · 3 of 89 slices shown]
[im 30/89  bone]
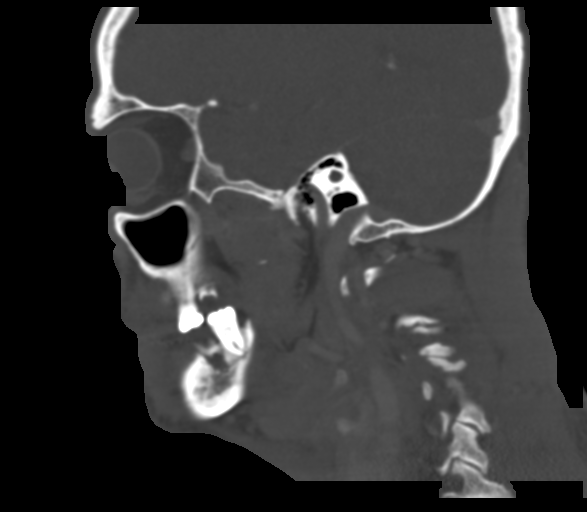
[im 45/89  bone]
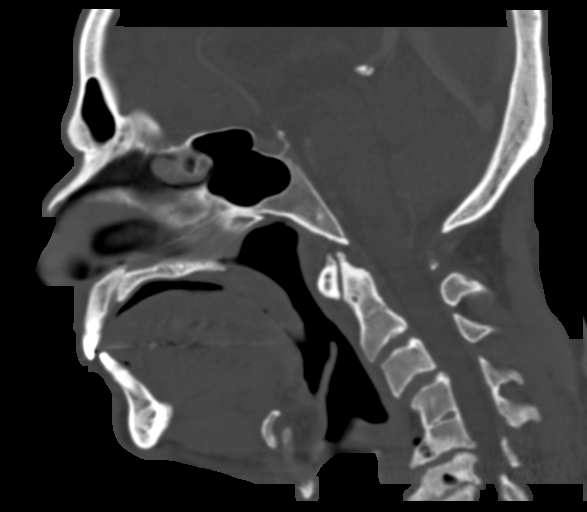
[im 59/89  bone]
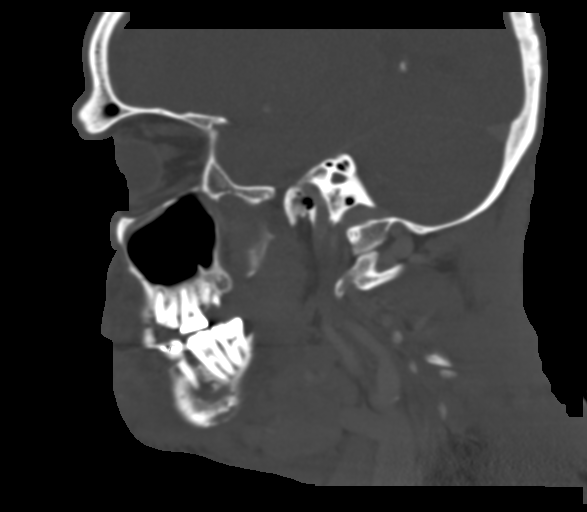

[15 of 47 positions shown; findings below may reference images not displayed]

RADIATION DOSE REDUCTION: This exam was performed according to the
departmental dose-optimization program which includes automated
exposure control, adjustment of the mA and/or kV according to
patient size and/or use of iterative reconstruction technique.

CONTRAST:  75mL OMNIPAQUE IOHEXOL 300 MG/ML  SOLN
FINDINGS: Osseous: Permeative destruction in the left mandible extending from
the level of the canine to the coronoid process and mandibular neck.
The regional soft tissues are swollen at the level of the masticator
space and cheek with ill-defined low-density and peripheral
enhancement along the superficial aspect of the diseased bone.
Diffuse dental caries with periapical erosion without single
offending tooth seen in the left mandible. Acute osteomyelitis or
aggressive malignancy are both considered with this radiographic
appearance. Case discussed with Dr. Frederic, somewhat protracted
symptoms but there is also fever and white count. Inflammatory
markers, blood cultures, or biopsy may clarify.

Cervical spine degeneration which is severe. There is C4-5 non
segmentation with subjacent predominant disc collapse, spurring, and
endplate sclerosis. Asymmetric upper right facet spurring
encroaching on the foramina.

Orbits: No evidence of inflammation or mass.

Sinuses: Negative

Soft tissues: Negative

Limited intracranial: Negative
IMPRESSION: Permeative destruction of the left mandible with extensive swelling
and ill-defined necrotic or purulent low-density along the outer
surface of the mandible in the buccal and masticator spaces. In the
setting of fever and leukocytosis, acute osteomyelitis is favored
over aggressive malignancy.

## 2023-05-18 IMAGING — CT CT MAXILLOFACIAL W/O CM
3 series · 15 of 47 positions shown, 18 images · non-contrast
Comparison: Maxillofacial CT dated 1 day prior

CLINICAL DATA: Mandible osteomyelitis



[Series 3: max soft · axial · 0.43mm/px · z∈[+1426,+1576]mm · 9 of 89 slices shown, 12 images]
[im 7/89  brain]
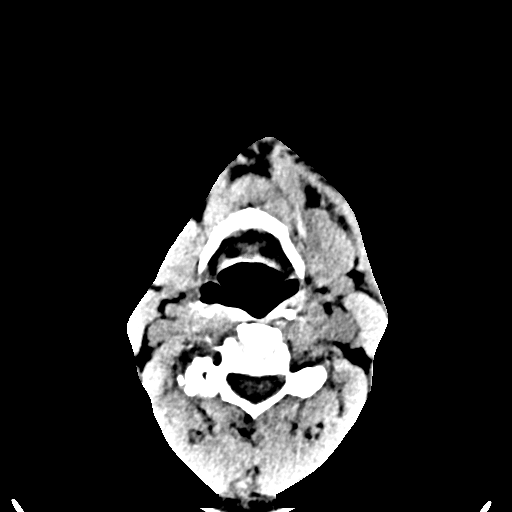
[im 7/89  bone]
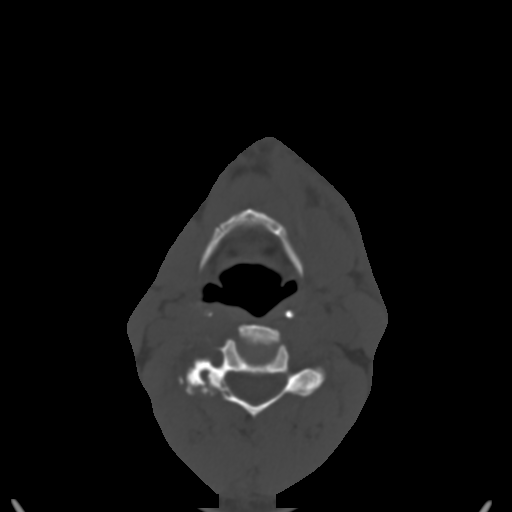
[im 16/89  bone]
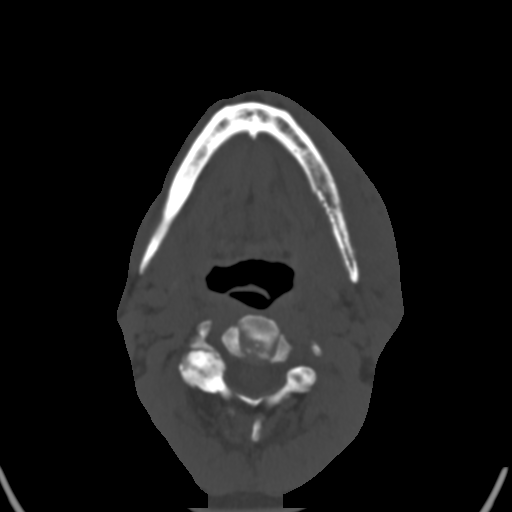
[im 25/89  bone]
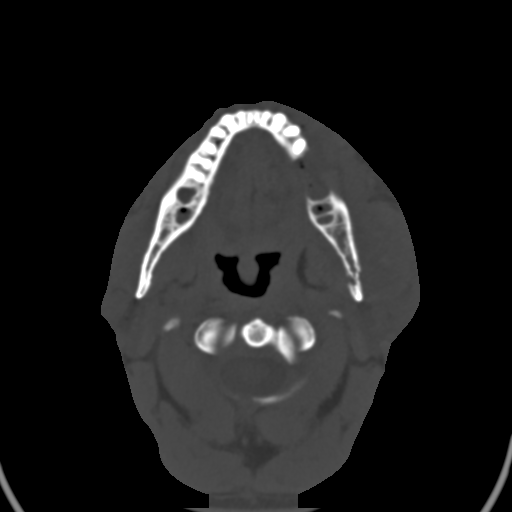
[im 34/89  bone]
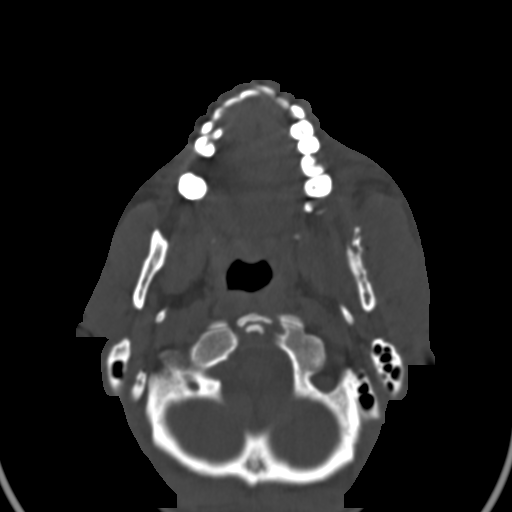
[im 46/89  brain]
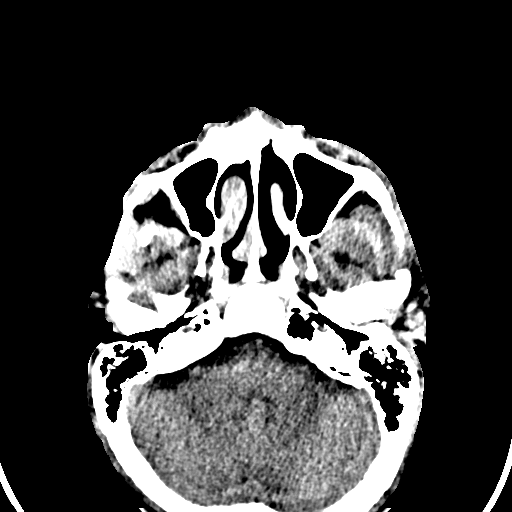
[im 46/89  bone]
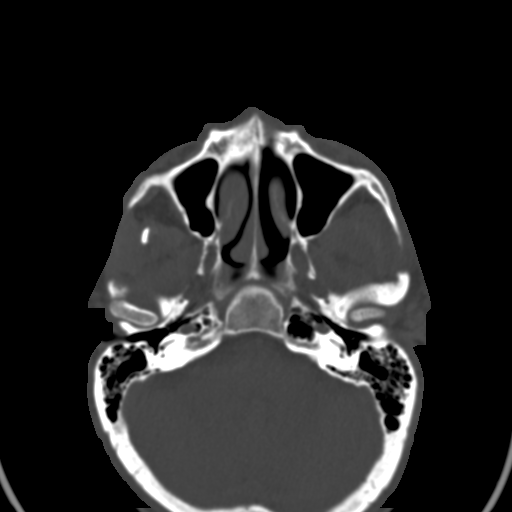
[im 55/89  bone]
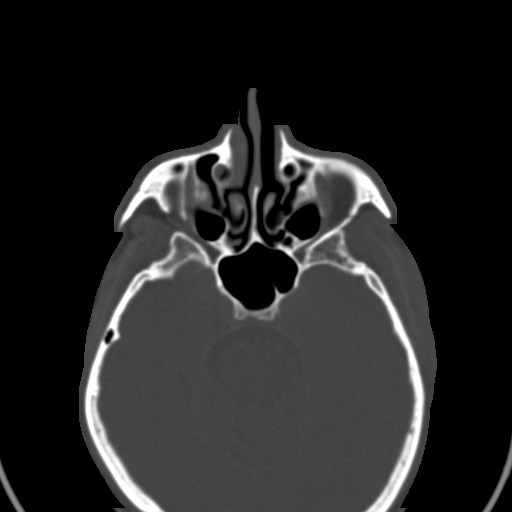
[im 64/89  bone]
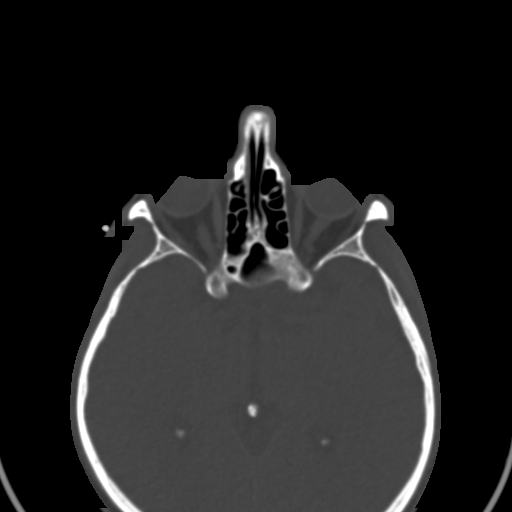
[im 73/89  bone]
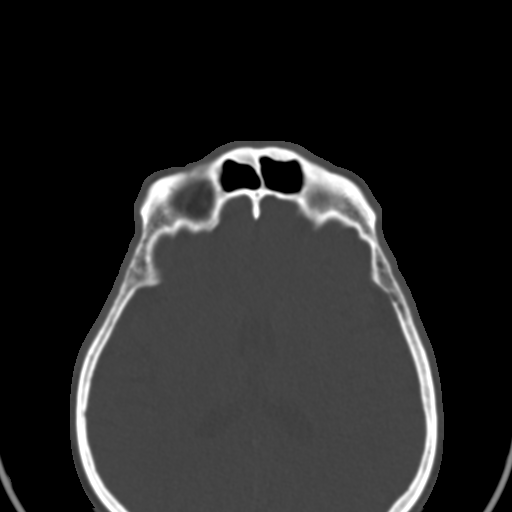
[im 82/89  brain]
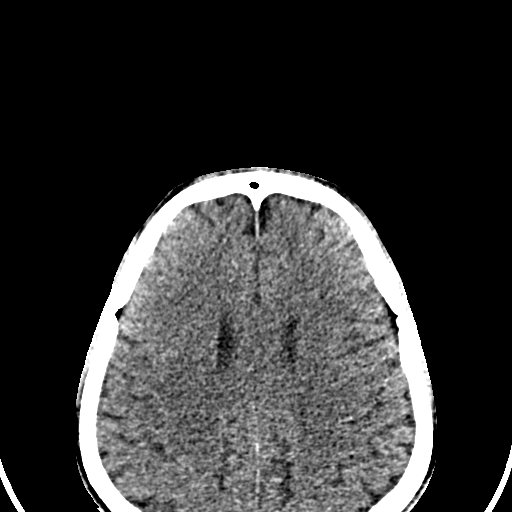
[im 82/89  bone]
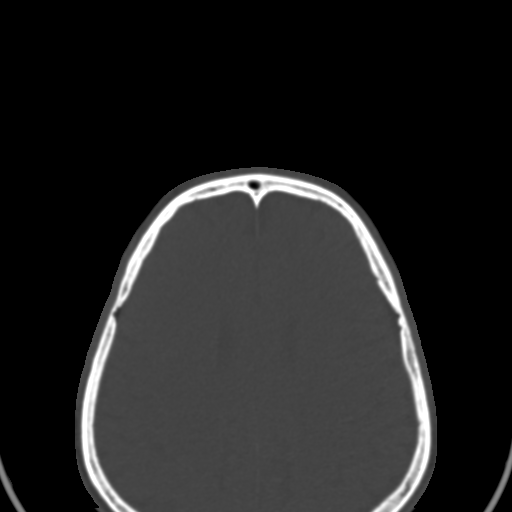

[Series 7: coronal soft · coronal · 0.36mm/px · 3 of 112 slices shown]
[im 38/112  bone]
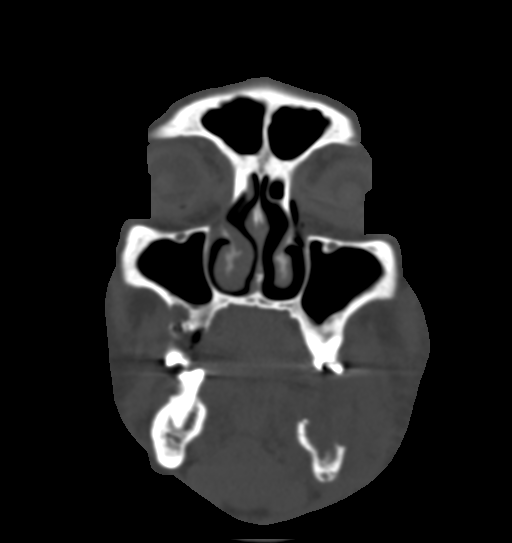
[im 50/112  bone]
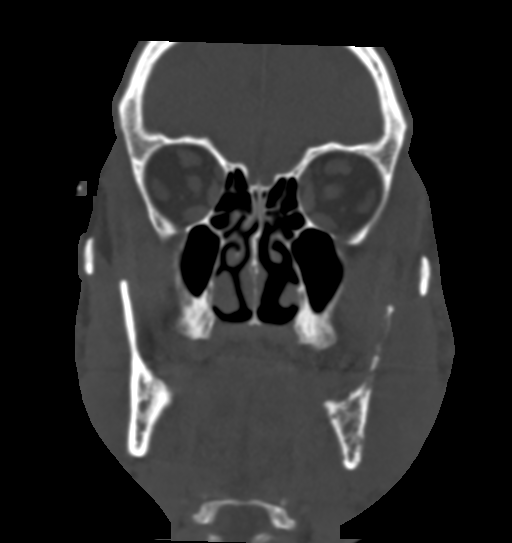
[im 62/112  bone]
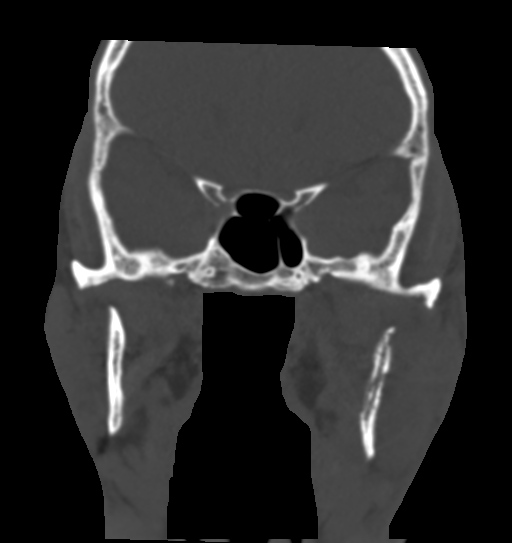

[Series 8: sagittal soft · sagittal · 0.38mm/px · 3 of 92 slices shown]
[im 31/92  bone]
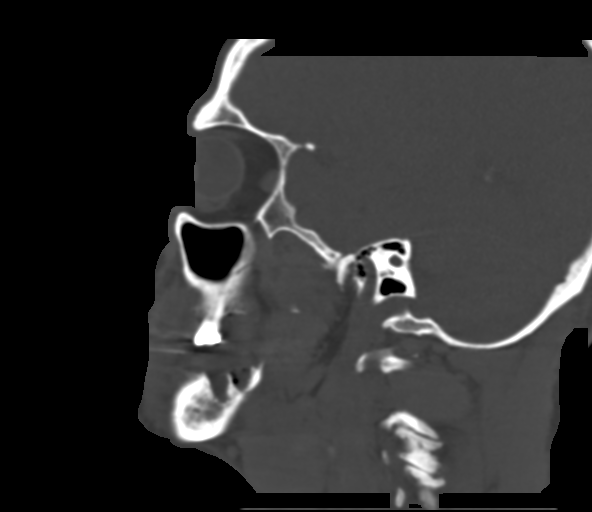
[im 46/92  bone]
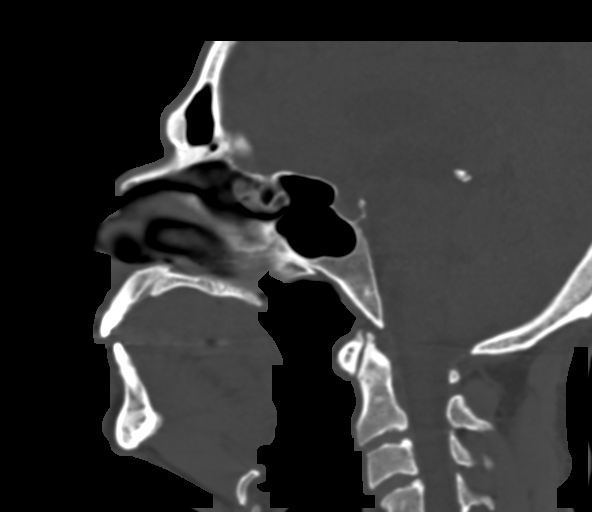
[im 61/92  bone]
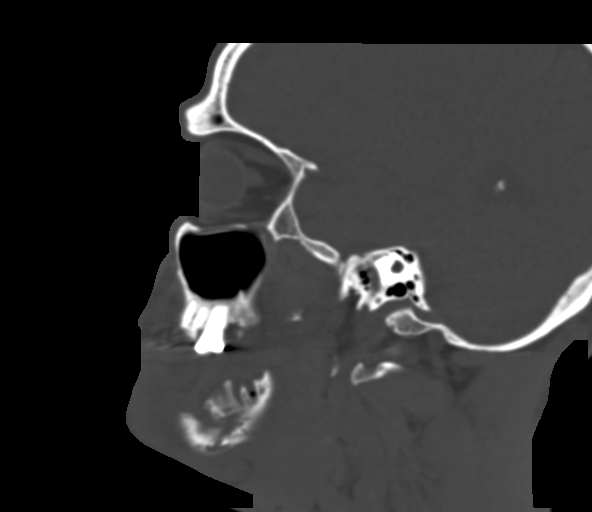

[15 of 47 positions shown; findings below may reference images not displayed]

FINDINGS: Osseous: There has been interval extraction of multiple mandibular
teeth. There is permeative destruction of the left hemi mandible
extending from the body throughout the coronoid process to the neck
of the mandibular condyle consistent with osteomyelitis. There is a
nondisplaced pathologic fracture of the body of the mandible which
violates the mandibular foramen ([id number]).

There is marked swelling in the overlying soft tissues of the left
face with asymmetric thickening of the platysma and muscles of
mastication. The previously seen peripheral enhancement is not
appreciated in the absence of intravenous contrast.

The mandibular condyles are normally situated. Carious lesions are
noted involving multiple of the remaining teeth.

Orbits: The globes and orbits are unremarkable.

Sinuses: The paranasal sinuses are clear.

Soft tissues: Extensive soft tissue swelling of the left face as
above.

Limited intracranial: Imaged portions of the intracranial
compartment are unremarkable.
IMPRESSION: 1. Permeative destruction of the left hemimandible as above remains
most consistent with osteomyelitis. Significant overlying soft
tissue swelling and thickening of the adjacent musculature is
similar to the prior study.
2. Nondisplaced fracture through the body of the mandible with
violation of the mandibular foramen.
3. Interval extraction of multiple mandibular teeth.

## 2023-05-19 IMAGING — US US RENAL
1 series · 15 of 25 positions shown · non-contrast
Comparison: None.

CLINICAL DATA: Remote history of renal cell carcinoma.

EXAM:
RENAL / URINARY TRACT ULTRASOUND COMPLETE

[Series 1: [facility] & wl · 15 of 46 slices shown]
[im 1/46]
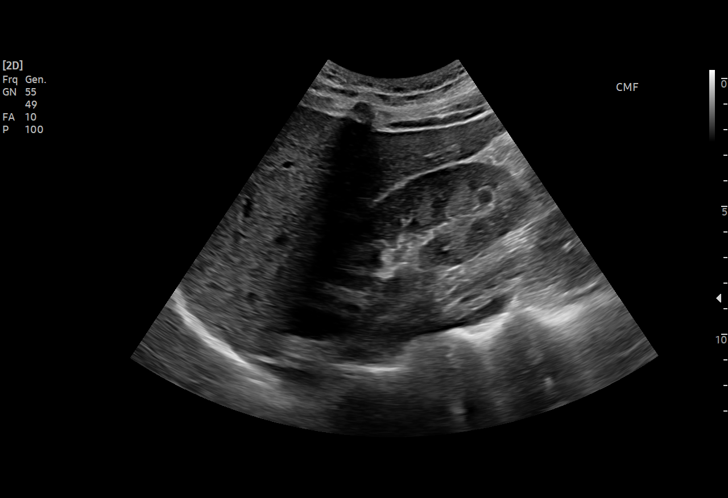
[im 4/46]
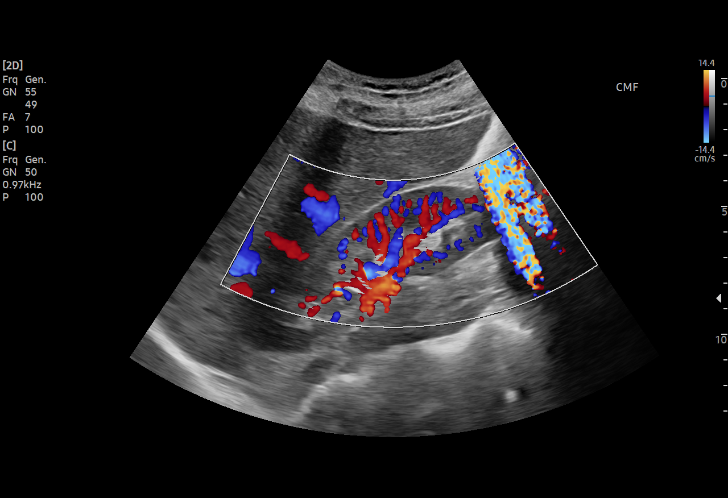
[im 8/46]
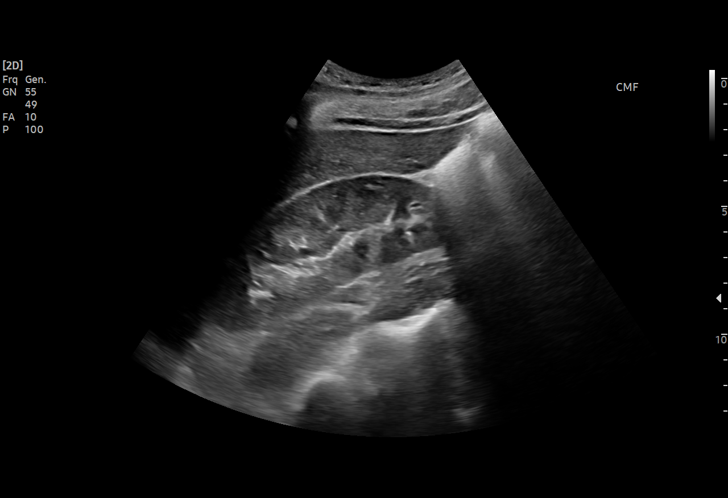
[im 10/46]
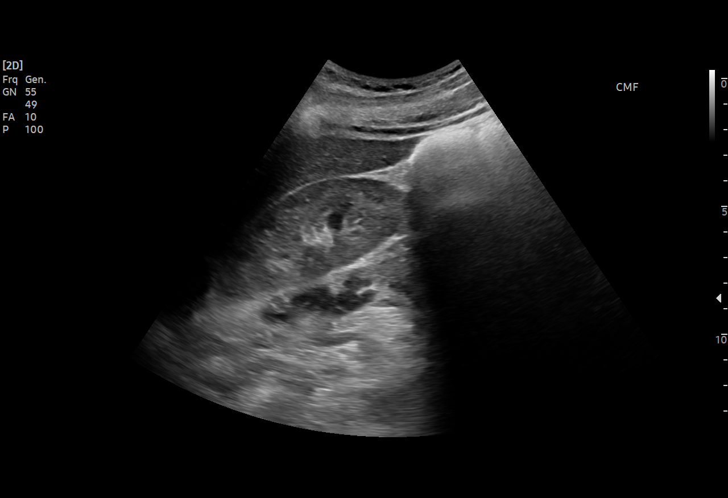
[im 14/46]
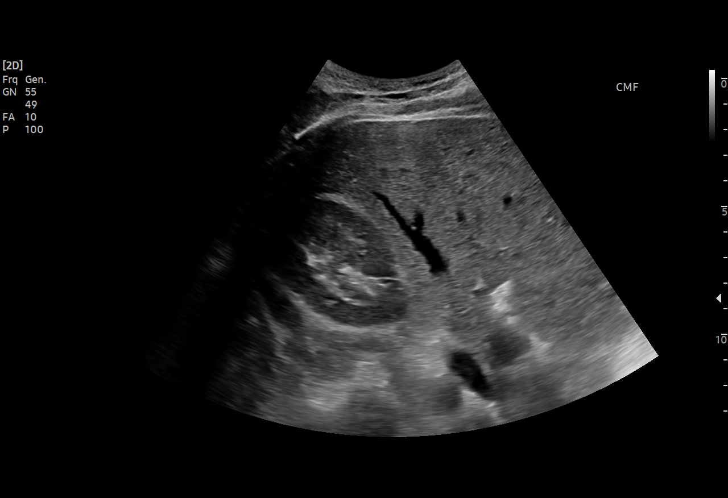
[im 17/46]
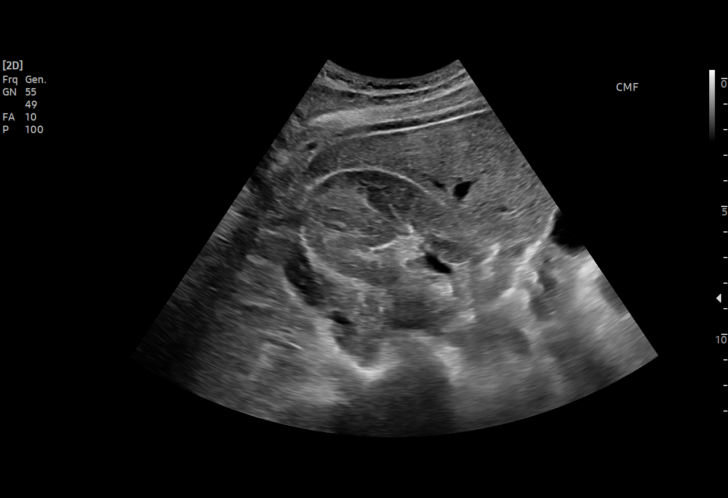
[im 19/46]
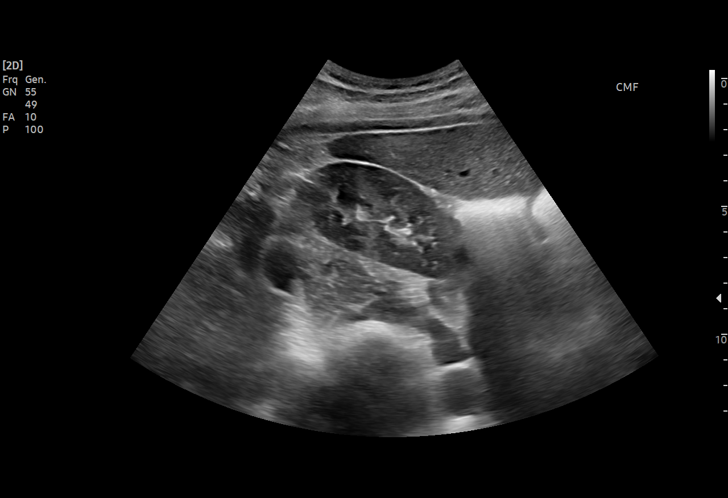
[im 23/46]
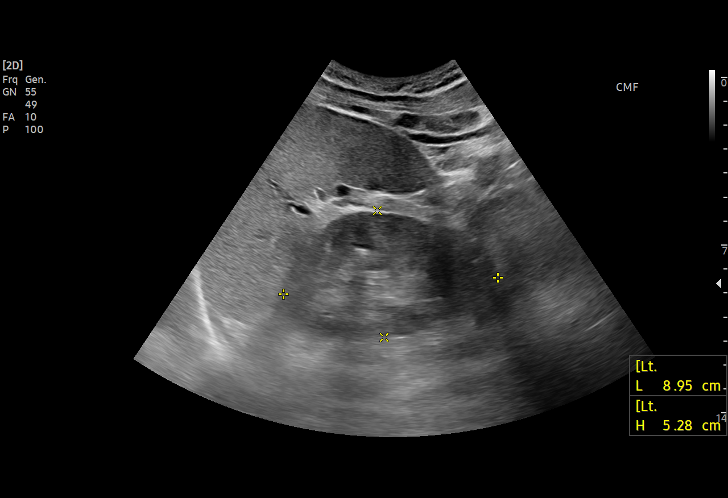
[im 27/46]
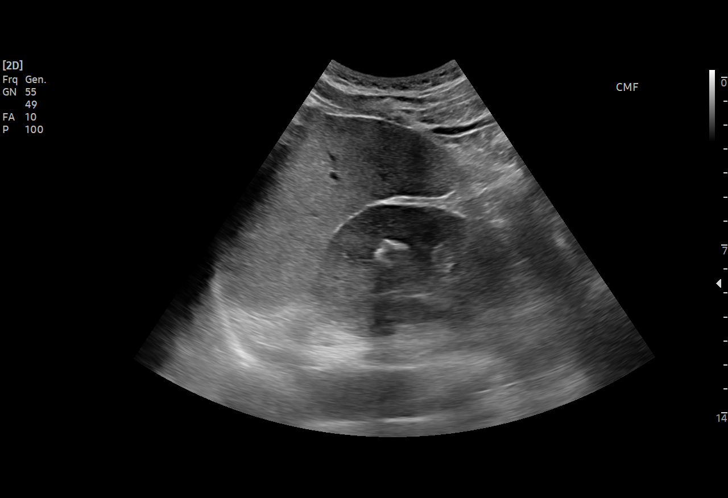
[im 29/46]
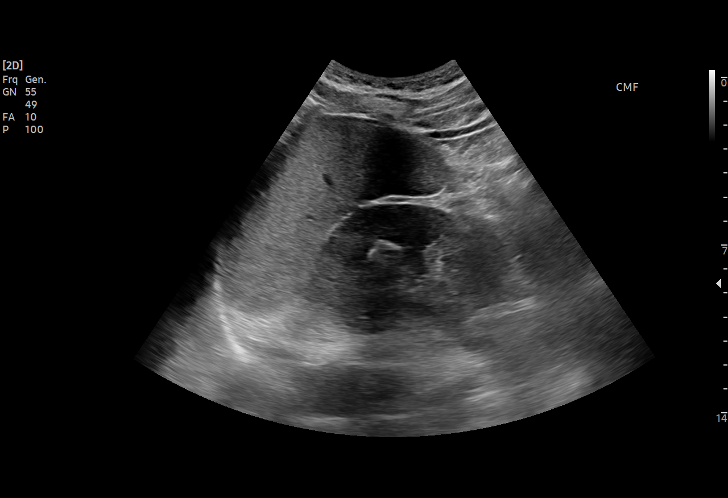
[im 32/46]
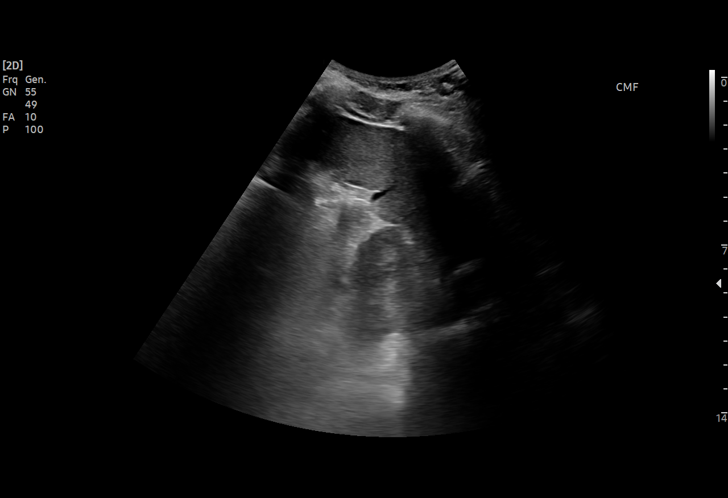
[im 36/46]
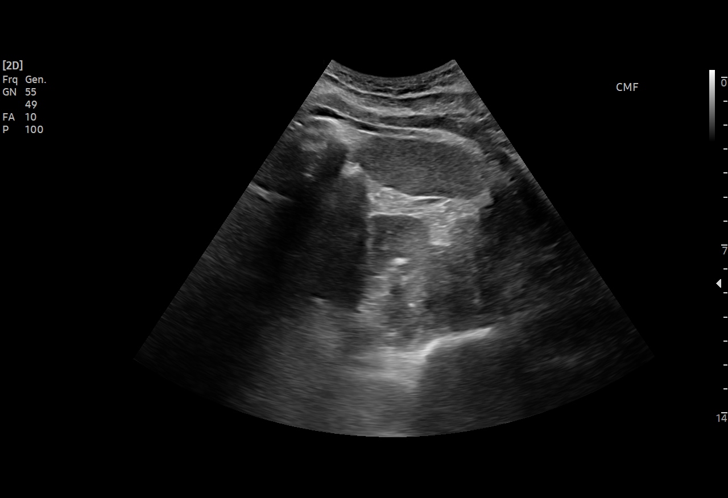
[im 38/46]
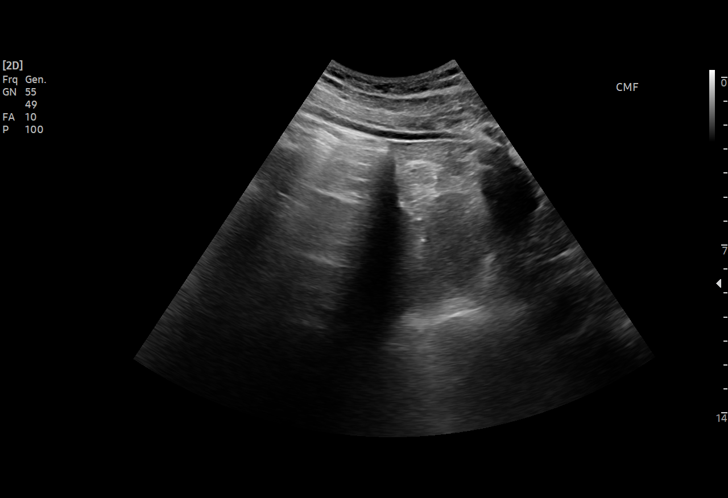
[im 42/46]
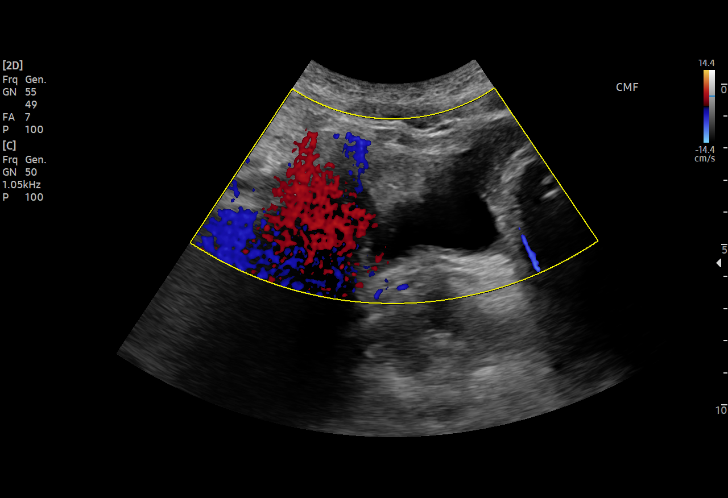
[im 46/46]
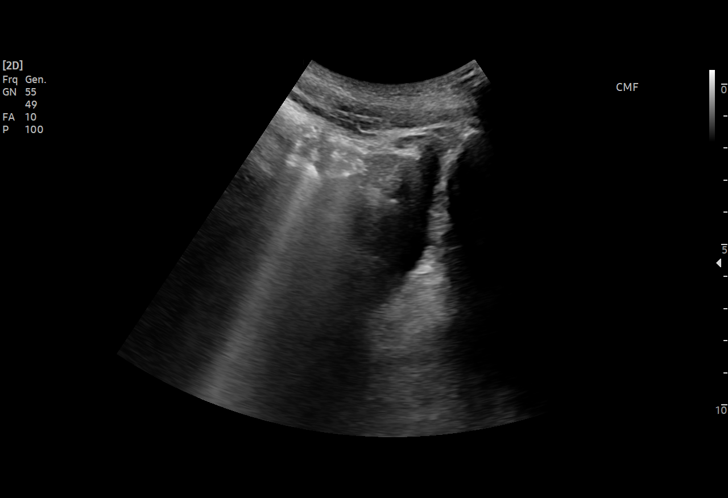

[15 of 25 positions shown; findings below may reference images not displayed]

FINDINGS: Right Kidney:

Renal measurements: 11.5 x 3.9 x 5.6 cm = volume: 130 mL.
Echogenicity within normal limits. No mass or hydronephrosis
visualized.

Left Kidney:

Renal measurements: 9.0 x 5.3 x 5.8 cm = volume: 142 mL.
Echogenicity within normal limits. No mass or hydronephrosis
visualized.

Bladder:

Appears normal for degree of bladder distention.

Other:

Examination is technically limited secondary to overlying bowel gas.
IMPRESSION: 1. Normal renal ultrasound. Note is made that small solid renal
lesions can not be excluded by ultrasound. If there is high clinical
concern, recommend further evaluation with MRI.
